# Patient Record
Sex: Female | Born: 1940 | Race: White | Hispanic: No | Marital: Married | Smoking: Never smoker
Health system: Southern US, Community
[De-identification: ages and names within clinical notes are randomized; demographics above are authoritative.]

## PROBLEM LIST (undated history)

## (undated) DIAGNOSIS — C679 Malignant neoplasm of bladder, unspecified: Secondary | ICD-10-CM

## (undated) DIAGNOSIS — F419 Anxiety disorder, unspecified: Secondary | ICD-10-CM

## (undated) DIAGNOSIS — H3530 Unspecified macular degeneration: Secondary | ICD-10-CM

## (undated) DIAGNOSIS — I10 Essential (primary) hypertension: Secondary | ICD-10-CM

## (undated) HISTORY — DX: Unspecified macular degeneration: H35.30

## (undated) HISTORY — PX: APPENDECTOMY: SHX54

## (undated) HISTORY — DX: Essential (primary) hypertension: I10

---

## 2009-05-29 ENCOUNTER — Encounter: Payer: Self-pay | Admitting: Internal Medicine

## 2009-09-15 ENCOUNTER — Encounter: Payer: Self-pay | Admitting: Internal Medicine

## 2009-09-17 ENCOUNTER — Encounter: Payer: Self-pay | Admitting: Internal Medicine

## 2009-09-18 ENCOUNTER — Encounter: Payer: Self-pay | Admitting: Internal Medicine

## 2009-10-29 ENCOUNTER — Encounter: Payer: Self-pay | Admitting: Internal Medicine

## 2009-11-20 ENCOUNTER — Encounter: Payer: Self-pay | Admitting: Internal Medicine

## 2009-11-27 ENCOUNTER — Encounter: Payer: Self-pay | Admitting: Internal Medicine

## 2010-02-05 ENCOUNTER — Encounter: Payer: Self-pay | Admitting: Internal Medicine

## 2010-03-22 ENCOUNTER — Observation Stay (HOSPITAL_COMMUNITY): Admission: EM | Admit: 2010-03-22 | Discharge: 2010-03-22 | Payer: Self-pay | Admitting: Emergency Medicine

## 2010-05-09 ENCOUNTER — Ambulatory Visit: Payer: Self-pay | Admitting: Internal Medicine

## 2010-05-09 DIAGNOSIS — Z8679 Personal history of other diseases of the circulatory system: Secondary | ICD-10-CM | POA: Insufficient documentation

## 2010-05-09 DIAGNOSIS — R5381 Other malaise: Secondary | ICD-10-CM | POA: Insufficient documentation

## 2010-05-09 DIAGNOSIS — I10 Essential (primary) hypertension: Secondary | ICD-10-CM | POA: Insufficient documentation

## 2010-05-09 DIAGNOSIS — R5383 Other fatigue: Secondary | ICD-10-CM

## 2010-05-09 DIAGNOSIS — F411 Generalized anxiety disorder: Secondary | ICD-10-CM | POA: Insufficient documentation

## 2010-05-29 ENCOUNTER — Telehealth: Payer: Self-pay | Admitting: Internal Medicine

## 2010-06-20 ENCOUNTER — Ambulatory Visit: Payer: Self-pay | Admitting: Internal Medicine

## 2010-06-20 ENCOUNTER — Encounter: Payer: Self-pay | Admitting: Internal Medicine

## 2010-06-20 ENCOUNTER — Ambulatory Visit: Payer: Self-pay | Admitting: Cardiology

## 2010-06-20 ENCOUNTER — Ambulatory Visit: Payer: Self-pay

## 2010-06-20 ENCOUNTER — Ambulatory Visit (HOSPITAL_COMMUNITY): Admission: RE | Admit: 2010-06-20 | Discharge: 2010-06-20 | Payer: Self-pay | Admitting: Internal Medicine

## 2010-07-16 ENCOUNTER — Telehealth: Payer: Self-pay | Admitting: Internal Medicine

## 2010-07-22 ENCOUNTER — Telehealth: Payer: Self-pay | Admitting: Internal Medicine

## 2010-08-16 ENCOUNTER — Telehealth: Payer: Self-pay | Admitting: Internal Medicine

## 2010-08-26 ENCOUNTER — Ambulatory Visit: Payer: Self-pay | Admitting: Internal Medicine

## 2010-08-26 DIAGNOSIS — R079 Chest pain, unspecified: Secondary | ICD-10-CM | POA: Insufficient documentation

## 2010-08-29 ENCOUNTER — Ambulatory Visit: Payer: Self-pay | Admitting: Internal Medicine

## 2010-09-03 LAB — CONVERTED CEMR LAB
ALT: 24 units/L (ref 0–35)
Albumin: 4.1 g/dL (ref 3.5–5.2)
Alkaline Phosphatase: 56 units/L (ref 39–117)
BUN: 14 mg/dL (ref 6–23)
Basophils Absolute: 0 10*3/uL (ref 0.0–0.1)
Basophils Relative: 0.5 % (ref 0.0–3.0)
Chloride: 97 meq/L (ref 96–112)
Creatinine,U: 136.6 mg/dL
Direct LDL: 92.3 mg/dL
Free T4: 0.85 ng/dL (ref 0.60–1.60)
Glucose, Bld: 84 mg/dL (ref 70–99)
HDL: 96.8 mg/dL (ref >39.00)
MCHC: 35.1 g/dL (ref 30.0–36.0)
Microalb, Ur: 7.5 mg/dL — ABNORMAL HIGH (ref 0.0–1.9)
Neutro Abs: 3.6 10*3/uL (ref 1.4–7.7)
Neutrophils Relative %: 68 % (ref 43.0–77.0)
RDW: 14.3 % (ref 11.5–14.6)
VLDL: 4.8 mg/dL (ref 0.0–40.0)

## 2010-09-18 ENCOUNTER — Telehealth: Payer: Self-pay | Admitting: Internal Medicine

## 2010-09-20 ENCOUNTER — Telehealth: Payer: Self-pay | Admitting: Internal Medicine

## 2010-09-23 ENCOUNTER — Telehealth: Payer: Self-pay | Admitting: Internal Medicine

## 2010-09-24 ENCOUNTER — Telehealth: Payer: Self-pay | Admitting: Internal Medicine

## 2010-10-01 ENCOUNTER — Telehealth: Payer: Self-pay | Admitting: Internal Medicine

## 2010-10-07 ENCOUNTER — Telehealth: Payer: Self-pay | Admitting: Internal Medicine

## 2010-10-07 ENCOUNTER — Telehealth (INDEPENDENT_AMBULATORY_CARE_PROVIDER_SITE_OTHER): Payer: Self-pay | Admitting: *Deleted

## 2010-10-18 ENCOUNTER — Encounter: Payer: Self-pay | Admitting: Internal Medicine

## 2010-10-19 ENCOUNTER — Telehealth (INDEPENDENT_AMBULATORY_CARE_PROVIDER_SITE_OTHER): Payer: Self-pay | Admitting: *Deleted

## 2010-10-20 ENCOUNTER — Telehealth (INDEPENDENT_AMBULATORY_CARE_PROVIDER_SITE_OTHER): Payer: Self-pay | Admitting: *Deleted

## 2010-10-21 ENCOUNTER — Telehealth: Payer: Self-pay | Admitting: Internal Medicine

## 2010-10-24 ENCOUNTER — Telehealth (INDEPENDENT_AMBULATORY_CARE_PROVIDER_SITE_OTHER): Payer: Self-pay | Admitting: Physician Assistant

## 2010-10-25 IMAGING — CR DG CHEST 2V
2 series · 2 of 2 positions shown · non-contrast
Comparison: None

CLINICAL DATA: Hypertension, weakness

CHEST - 2 VIEW

[w chest pa]
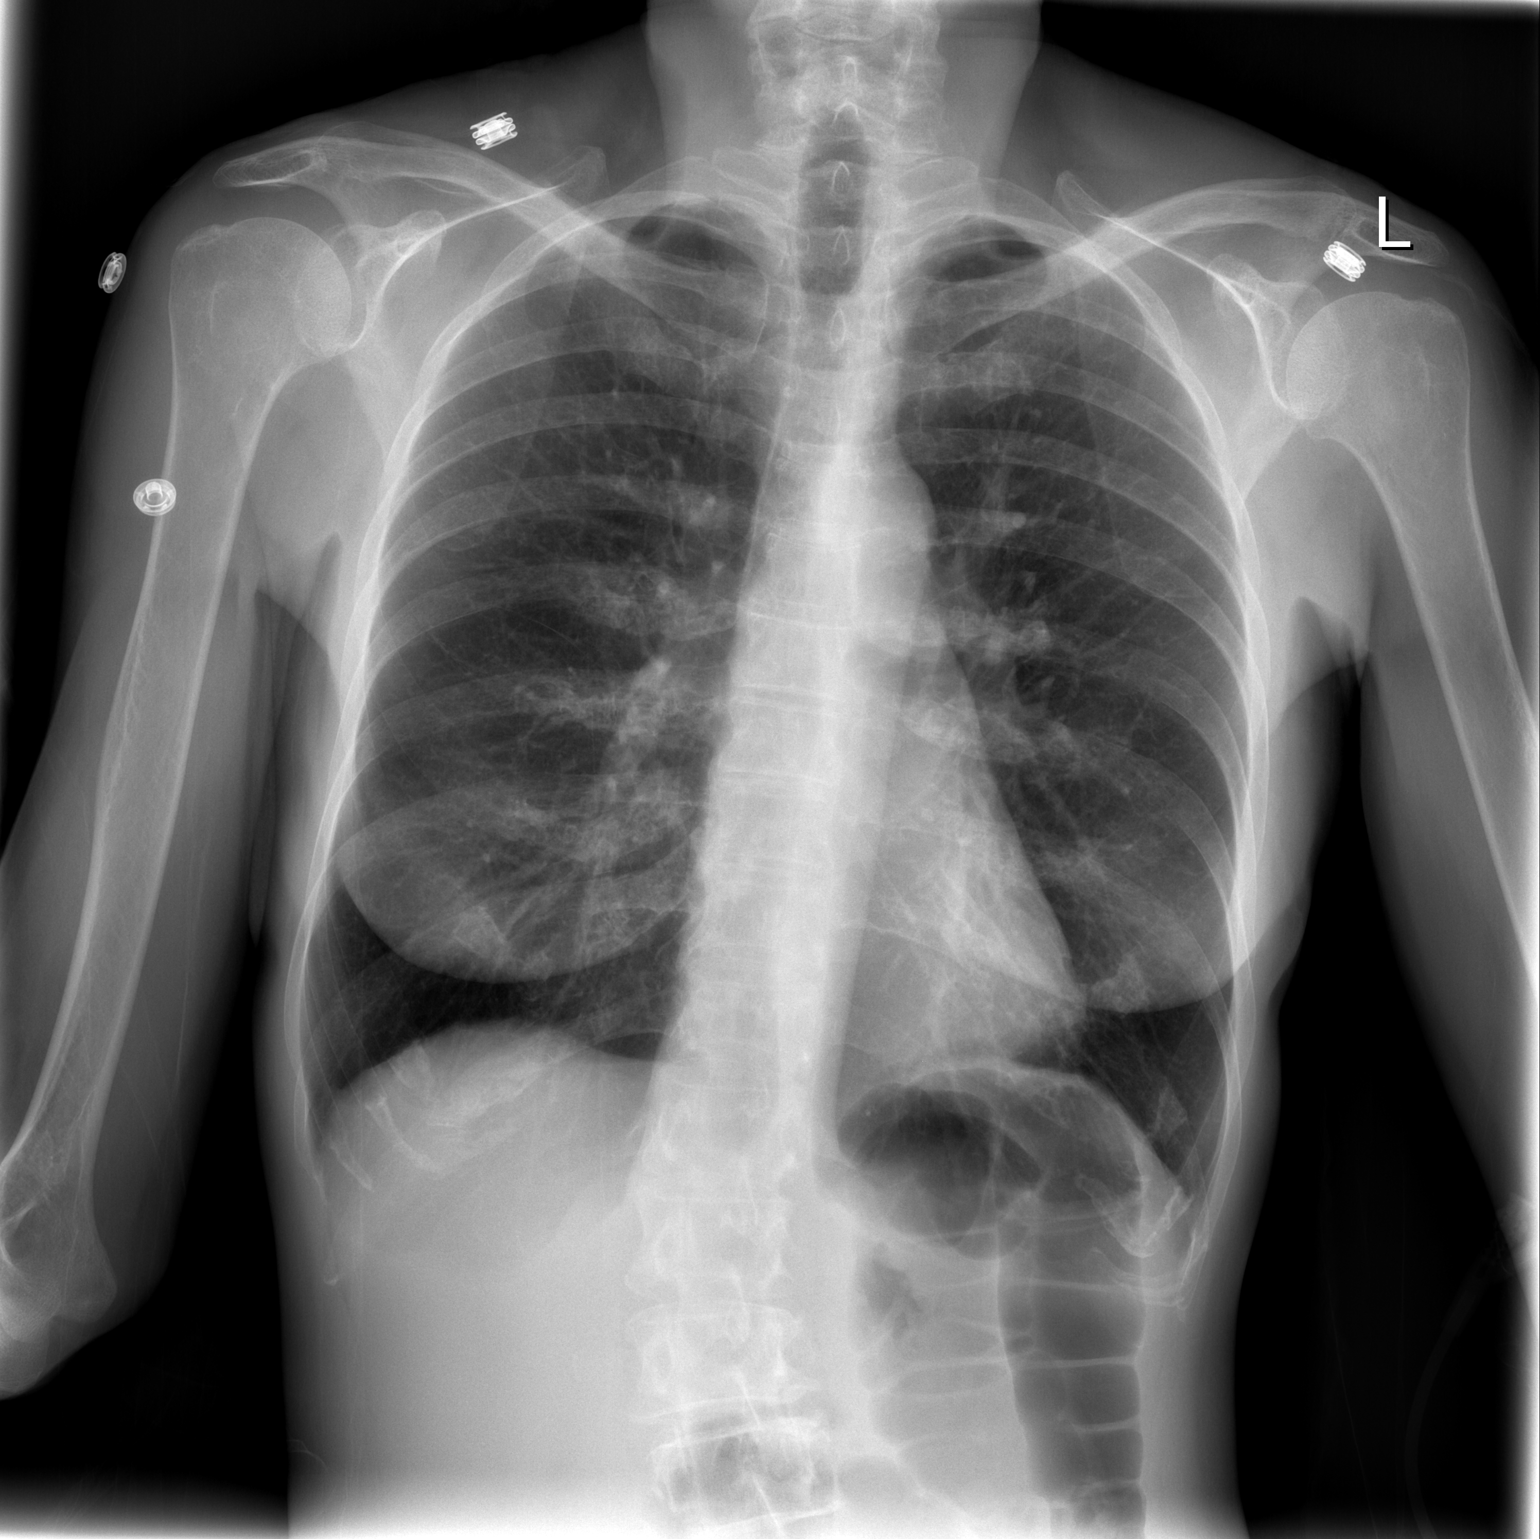

[w chest lat]
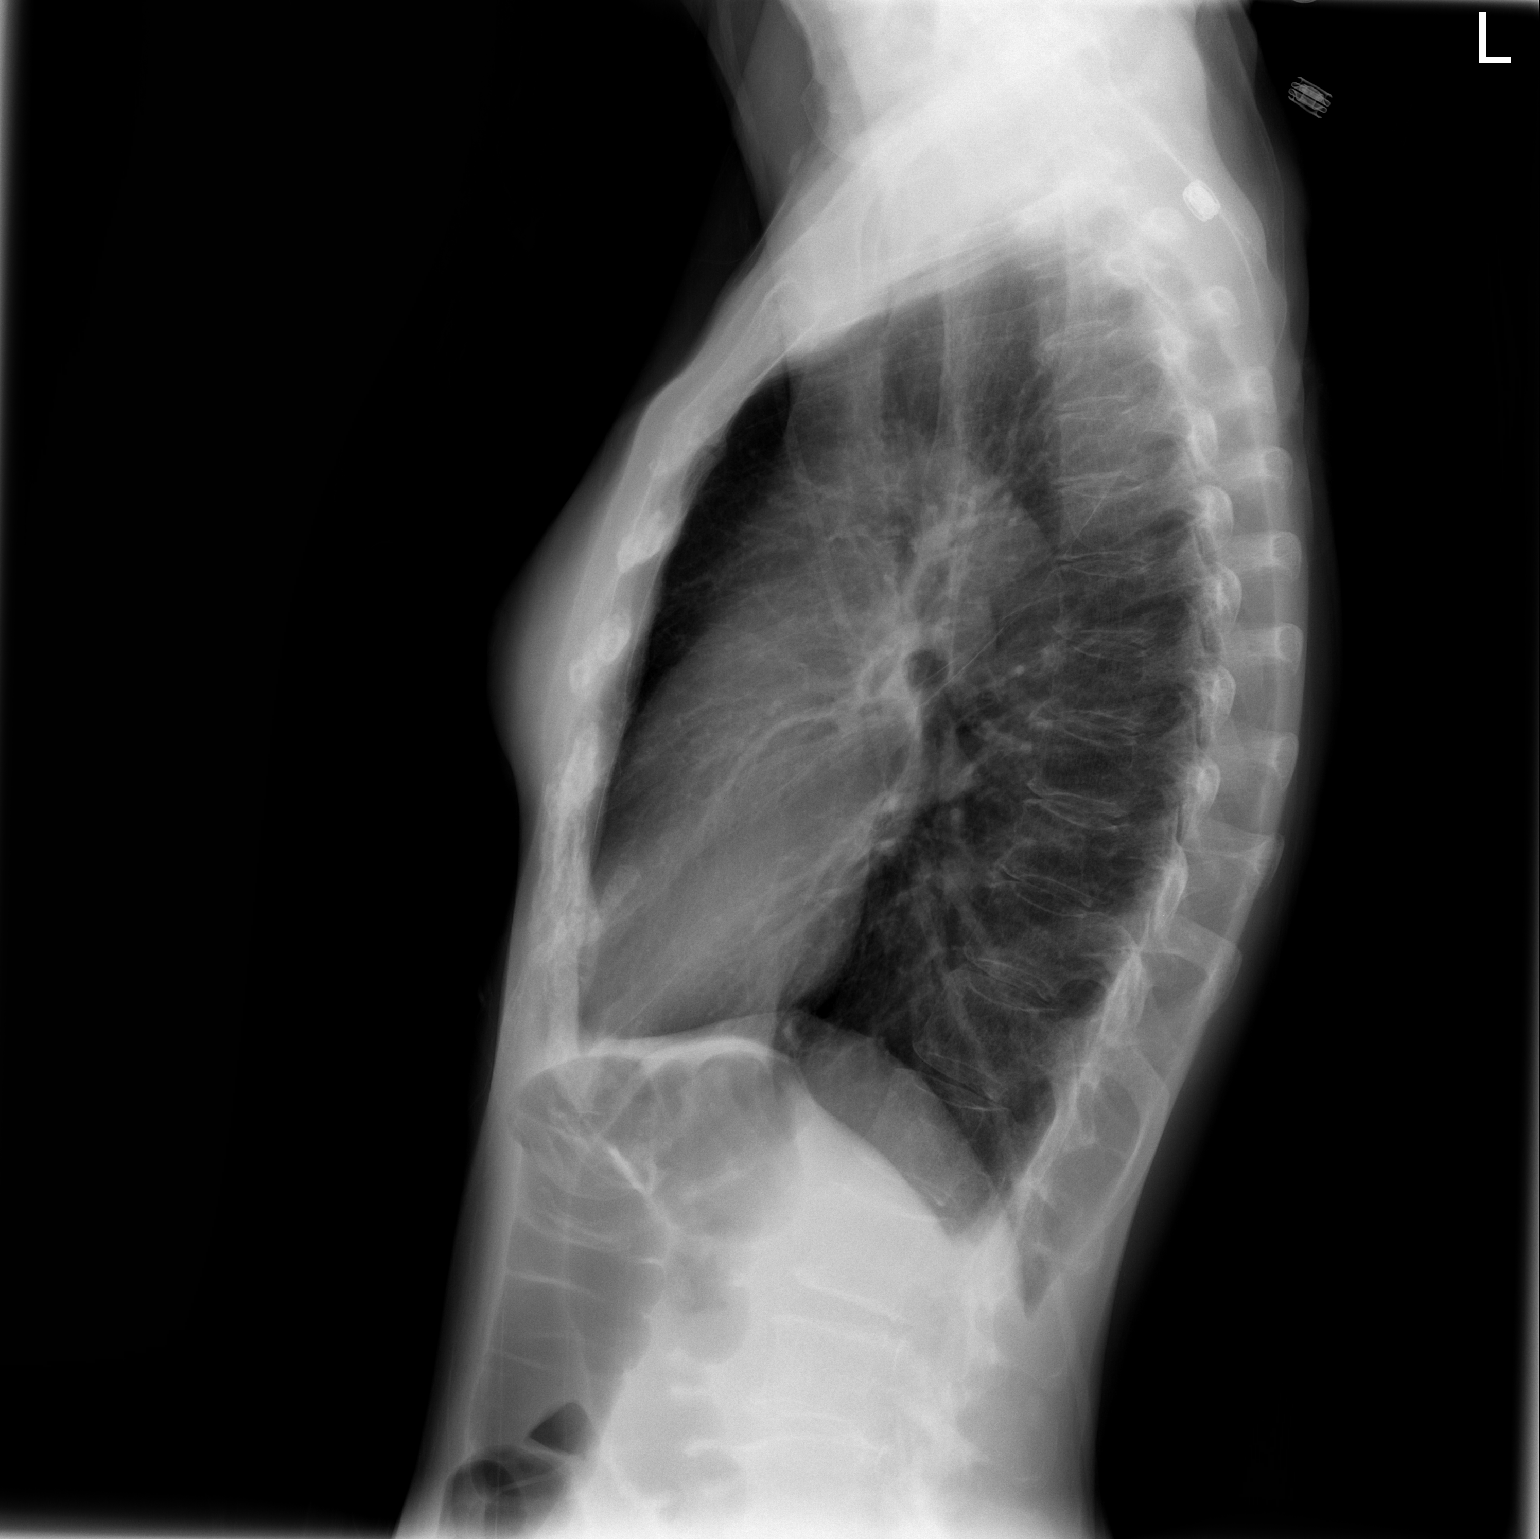

[2 of 2 positions shown; findings below may reference images not displayed]

FINDINGS: Normal heart size, mediastinal contours, and pulmonary vascularity.
Emphysematous and minimal bronchitic changes compatible with COPD.
Scattered costal cartilaginous calcifications.
No pulmonary infiltrate, pleural effusion, or pneumothorax.
Bones demineralized with thoracolumbar dextroconvex scoliosis.
IMPRESSION: COPD.
No acute abnormalities.

## 2010-10-28 ENCOUNTER — Telehealth: Payer: Self-pay | Admitting: Internal Medicine

## 2010-10-29 ENCOUNTER — Telehealth: Payer: Self-pay | Admitting: Internal Medicine

## 2010-11-05 ENCOUNTER — Telehealth: Payer: Self-pay | Admitting: Internal Medicine

## 2010-11-19 ENCOUNTER — Telehealth: Payer: Self-pay | Admitting: Internal Medicine

## 2010-11-22 ENCOUNTER — Telehealth: Payer: Self-pay | Admitting: Internal Medicine

## 2010-12-24 NOTE — Progress Notes (Signed)
Summary: pt calling re seeing lung specialist  Phone Note Call from Patient   Caller: Patient [phone] Reason for Call: Talk to Nurse Summary of Call:  fyi pt went to lung specialist, will do a sleep apnea test, not sure if she has emphysema yet or not, if so it's very mild, will call back when test are complete- Initial call taken by: Glynda Jaeger,  October 29, 2010 4:22 PM  Follow-up for Phone Call        ok will let Dr Gala Romney know Meredith Staggers, RN  October 29, 2010 4:47 PM

## 2010-12-24 NOTE — Letter (Signed)
Summary: [facility] - Post ER Instructions  [facility] - Post ER Instructions   Imported By: Marylou Mccoy 10/22/2010 09:16:59  _____________________________________________________________________  External Attachment:    Type:   Image     Comment:   External Document

## 2010-12-24 NOTE — Progress Notes (Signed)
Summary: questions re medication  Phone Note Call from Patient   Caller: Patient [phone] Reason for Call: Talk to Nurse Summary of Call: pt calling re medication labetalol- "trouble keeping it down all day" pls call  Initial call taken by: Glynda Jaeger,  August 16, 2010 9:18 AM  Follow-up for Phone Call        Pt. said she was experiencing diarrhea and noticed her BP was elevated in the evening between 5-7pm. Advised her to keep a record of her BP readings since her diarrhea is improving and to continue her BP med. routine & to call us back with any further BP concerns. She will keep a record of her readings. Whitney Maeola Sarah RN  August 16, 2010 9:25 AM

## 2010-12-24 NOTE — Progress Notes (Signed)
Summary: pt b/p is elevated again  Phone Note Call from Patient Call back at Home Phone [phone]   Caller: Patient Reason for Call: Talk to Nurse, Talk to Doctor Summary of Call: pt was put on new b/p meds and it stsyed down until the 9th and now it's going back up and it was 169/109 today what dose she need to do Initial call taken by: Omer Jack,  September 18, 2010 12:22 PM  Follow-up for Phone Call        pts BP had been doing well since switching to [place], but she was started on an antibiotic and had a reaction and her BP has been up since then her last dose was on Mon. advised give it some time, she is agreeable wil cont. to monitor and call back early next week if still elevated.

## 2010-12-24 NOTE — Progress Notes (Signed)
Summary: Cardiology- BP Issues  Phone Note Call from Patient Call back at Home Phone [phone]   Caller: Husband Reason for Call: Talk to Doctor Summary of Call: Pt's husband called concerning BP.  Last reading was 174/116, pt is not experiencing headaches or blurred vision now.  She feels alright.  Pt's husband would like to know if there is anything she can take to help her BP before it starts to get too high.  I have reviewed the pts medication and asked her to double her double her benicar to 40 mg by mouth daily and keep a log of her pressures.  Pt is to go to the hospital if her pressures do not lower with the increased benicar, they voice understanding.  They appreciated the call back.  Appears pt will need a f/u appt this week to discuss other BP options, pt will call on Monday.   Initial call taken by: Robbi Garter NP-PA,  October 20, 2010 8:01 AM     Appended Document: Cardiology- BP Issues agree  Appended Document: Cardiology- BP Issues lmtcb./cy  Appended Document: Cardiology- BP Issues PT AWARE./CY

## 2010-12-24 NOTE — Progress Notes (Signed)
Summary: question on meds  Phone Note Call from Patient Call back at Home Phone [phone]   Refills Requested: Medication #1:  DOXAZOSIN MESYLATE 1 MG TABS Take 1 tablet by mouth two times a day. Caller: Spouse/kenneth Reason for Call: Talk to Nurse Summary of Call: pt husband  has question on meds LABETALOL HCL 100 MG and DOXAZOSIN MESYLATE 1 MG. pt had to be taken to the hospital because of her meds. Initial call taken by: Roe Coombs,  October 07, 2010 8:12 AM  Follow-up for Phone Call        spoke w/pt and she adv that this past Sat am she took 1/2 pill of Doxazosin and whole pill in eveing and was so disoriented. B/P 197/109 last night and disoriented and pt went to [facility] ED-she was given clonidine and released. Pt will not take Doxazosin. Needs another med for B/P control. Will await further direction from Dr. Gala Romney.  Follow-up by: Claris Gladden RN,  October 07, 2010 8:55 AM  Additional Follow-up for Phone Call Additional follow up Details #1::        changed to Benicar. we'll see how she does. Dolores Patty, MD, [facility]  October 09, 2010 2:26 PM

## 2010-12-24 NOTE — Progress Notes (Signed)
Summary: BP Meds  Phone Note Call from Patient Call back at Home Phone [phone]   Caller: Patient Reason for Call: Talk to Doctor Summary of Call: Returned call from patient concerned with BP medication she was giving in the emergency department, she had some blurred vision which is improving.  Pt states she went to the ER for elevated BP (200s/110s).  A chest xray was completed that showed emphysema.  Pt was given HCTZ 25 mg daily to go along with her current regimen.  Pt called to see if this was ok to take with her current medication as well as let us know that she has emphysema.  I have informed her that this medication will not interact with her other BP meds.  I have asked her to record her pressures.  She states she would like to discuss this with DB more and I told her I would forward the message to his nurse so she can return the call.  The patient was very appreciative.   Initial call taken by: Robbi Garter NP-PA,  October 19, 2010 5:22 PM

## 2010-12-24 NOTE — Progress Notes (Signed)
Summary: MEDICATION QUESTIONS  Phone Note Call from Patient Call back at Home Phone [phone]   Caller: Patient Summary of Call: QUESTION ABOUT MEDICATIONS Initial call taken by: Judie Grieve,  October 28, 2010 3:18 PM  Follow-up for Phone Call        pt states she is doing better--asking if she can take one-half of HCTZ tomorrow morning--asvised pt if her B/P is not too low it would be OK to take one-half of HCTZ--pt states she will check her B/P tomorrow before she takes medications

## 2010-12-24 NOTE — Progress Notes (Signed)
Summary: wants to change med  Phone Note Call from Patient   Caller: Patient Reason for Call: Talk to Nurse Summary of Call: pt's mouth blisters worse-found on internet that amlodipine by itself won't cause this, but with a history of gingivitis it will-can she get med changed? bp doing well-uses [facility] Initial call taken by: Glynda Jaeger,  July 22, 2010 8:37 AM  Follow-up for Phone Call        spoke w/pt she went to walk-in clinic last week and was told the mouth sourse are gingivits caused by amlodipine, they gave her clotrimazole 10mg  lozenges but told her it probably wouldn't help b/c it was from the amlodipine and that needed to be changed to another med.  Advised pt she can stop amlodipine and will discuss w/Dr Bensimhon on Wed, she is terrified to stop due to her BP it has been doing great and she is scared it will shoot up if she stops, will discuss w/Dr Bensimhon on [employer], RN  July 22, 2010 4:26 PM   Additional Follow-up for Phone Call Additional follow up Details #1::        pt remembered a med she is allergic to, lisinopril Glynda Jaeger  July 23, 2010 11:49 AM     Additional Follow-up for Phone Call Additional follow up Details #2::    lets switch to procardia xl 60 once daily. please check with sally to make sure corss reactivity is not high. Dolores Patty, MD, [facility]  July 23, 2010 10:24 PM  discussed w/Sallly, procardia is calcium channel blocker so will have same risk of gingivitis, will discuss w/Dr Bensimhon and call pt in AM Meredith Staggers, RN  July 24, 2010 6:28 PM   discussed w/Dr Leory Plowman change pt to cozaar 25mg  daily pt is aware and new rx sent into pharmacy,  Meredith Staggers, RN  July 25, 2010 10:58 AM   New/Updated Medications: LOSARTAN POTASSIUM 25 MG TABS (LOSARTAN POTASSIUM) Take 1 tablet by mouth once a day Prescriptions: LOSARTAN POTASSIUM 25 MG TABS (LOSARTAN POTASSIUM) Take 1 tablet by mouth once a day  #30 x 6  Entered by:   Meredith Staggers, RN   Authorized by:   Dolores Patty, MD, [facility]   Signed by:   Meredith Staggers, RN on 07/25/2010   Method used:   Electronically to        [facility] [phone]* (retail)       [address]       [place], [place]  [address]       Ph: [phone]       Fax: [phone]   RxID:   [id number]

## 2010-12-24 NOTE — Progress Notes (Signed)
Summary: pt has questions re medication  Phone Note Call from Patient   Caller: Patient [phone] Reason for Call: Talk to Nurse Summary of Call: pt having problem with bp, has questions re med and water pills can be reached until 2p or after 4p Initial call taken by: Glynda Jaeger,  October 21, 2010 8:16 AM  Follow-up for Phone Call        HAD TO GO TO THE HOSPITAL ON FRIDAY 165/90'S UP TO 222/122.  WAS GIVEN "WATER PILL AND CLONIDINE"  TOOK 2 BENICAR AS ORDERED 104/70 TODAY AND SHE IS HAPPY WITH HER BLOOD PRESSURE BUT HER  HEAD FEELS HEAVY AND IS HAVING HEADACHES AND FEELS TIRED.  SHE IS SEEING HER PRIMARY MD TODAY.  SHE WILL CONTINUE MEDS AS RXED AND FOLLOW UP WITH PRIMARY CARE AND DB AS ORDERED. Follow-up by: Charolotte Capuchin, RN,  October 21, 2010 9:35 AM

## 2010-12-24 NOTE — Progress Notes (Signed)
Summary: pt having sores in mouth  Phone Note Call from Patient   Caller: Patient [phone] Reason for Call: Talk to Nurse Summary of Call: pt calling re problems with bp-getting sores in mouth again-should she see pcp- pt on nisolditine Initial call taken by: Glynda Jaeger,  September 23, 2010 1:38 PM  Follow-up for Phone Call        having sores on both sides of mouth - white sore patches-pt will contact her primary care MD for eval.  she will call back if no improvment Follow-up by: Charolotte Capuchin, RN,  September 23, 2010 2:48 PM     Appended Document: pt having sores in mouth spoke w/pt she is seeing pcp this afternoon at 2:30, will let us know what they say

## 2010-12-24 NOTE — Progress Notes (Signed)
Summary: meds question on 7/28  Phone Note Call from Patient Call back at Home Phone [phone]   Caller: Patient Reason for Call: Talk to Nurse Details for Reason: has question regarding appt on 7/28-meds Initial call taken by: Lorne Skeens,  May 29, 2010 2:34 PM  Follow-up for Phone Call        spoke w/pt regarding meds Meredith Staggers, RN  May 29, 2010 6:01 PM

## 2010-12-24 NOTE — Progress Notes (Signed)
Summary: re mouth sores  Phone Note Call from Patient   Caller: Patient [phone] Reason for Call: Talk to Nurse Summary of Call: pt went to dr this pm-they feel that the med nisoldipine is causing her mouth sores-says will not clear up completely while she is taking it-what to do? Initial call taken by: Glynda Jaeger,  September 24, 2010 3:39 PM  Follow-up for Phone Call        per Dr Gala Romney will stop Sular and start pt on Doxazosin 1mg  daily, she is aware and new rx sent in [facility], RN  September 24, 2010 3:50 PM     New/Updated Medications: DOXAZOSIN MESYLATE 1 MG TABS (DOXAZOSIN MESYLATE) Take 1 tablet by mouth once a day Prescriptions: DOXAZOSIN MESYLATE 1 MG TABS (DOXAZOSIN MESYLATE) Take 1 tablet by mouth once a day  #30 x 6   Entered by:   Meredith Staggers, RN   Authorized by:   Dolores Patty, MD, [facility]   Signed by:   Meredith Staggers, RN on 09/24/2010   Method used:   Electronically to        [facility] [phone]* (retail)       [address]       [place], [place]  [address]       Ph: [phone]       Fax: [phone]   RxID:   [id number]

## 2010-12-24 NOTE — Progress Notes (Signed)
Summary: pt b/p is not going down  Phone Note Call from Patient Call back at Home Phone [phone]   Caller: Patient Reason for Call: Talk to Nurse, Talk to Doctor Summary of Call: pt b/p is not going down and she wants to talk to someone Initial call taken by: Omer Jack,  September 20, 2010 12:20 PM  Follow-up for Phone Call        spoke w/pt she states BP is cont. to run high for past week, last week she was put on cefdinir 300mg  two times a day and she dev a reaction which included rash and elevated BP med was stopped.  No she cont. to c/o elevated BP she states it has been running 150-183/90-110, today was 154/89 and 158/90 she c/o headaches w/these elevated BP, will send mess to Dr Gala Romney to review she is aware if she doesn't hear from me today to cont. to monitor Meredith Staggers, RN  September 20, 2010 2:39 PM   Additional Follow-up for Phone Call Additional follow up Details #1::        start doxazosin 1mg  daily. Dolores Patty, MD, [facility]  September 22, 2010 11:32 PM

## 2010-12-24 NOTE — Letter (Signed)
Summary: [facility]   [facility]   Imported By: Roderic Ovens 05/20/2010 08:54:51  _____________________________________________________________________  External Attachment:    Type:   Image     Comment:   External Document

## 2010-12-24 NOTE — Letter (Signed)
Summary: [facility]   [facility]   Imported By: Roderic Ovens 05/20/2010 08:55:30  _____________________________________________________________________  External Attachment:    Type:   Image     Comment:   External Document

## 2010-12-24 NOTE — Progress Notes (Signed)
Summary: Cardiology Phone Note - BP Issues  Phone Note Call from Patient Call back at Home Phone [phone]   Caller: Patient Summary of Call: Pt called this evening 10/07/10 at 8:30pm, stating she had not yet heard back from prior phone call. Apologized for delay; pt was not upset, just concerned. She states her BP is still elevated this evening at 171/93, and she has not received a substitute yet for the doxazosin she could not tolerate. Chart reviewed: pt has hx of essential HTN and anxiety, as well as multiple medication intolerances including sular, amlodipine, clonidine, and now doxazosin. Pt tried losartan before but had chest pain "several days later," uncertain if symptom was directly related to ARB. For now, will try to initiate Benicar. Pt's last BMET 08/2010 showed a normal creatinine. [facility] was closing in 10 minutes so I found her a 24-hr [facility] and called in Benicar 20mg  by mouth daily disp #30 with 1 refill. Pt was appreciative and is going to pick up prescription. Will forward message to triage RN as well as Dr. Gala Romney for review. We should tread lightly in this pt with medication intolerances.  May benefit from referral back to PCP for anxiety. If possible, please call pt this Thurs or Friday to see if she is still tolerating the medicine and arrange for BMET in 1 week to ensure stable K+. Initial call taken by: Ronie Spies PA-C     Appended Document: Cardiology Phone Note - BP Issues spoke w/pt she states she is doing ok right now she is a little more fatigued than usually but will cont. w/med and see how she does

## 2010-12-24 NOTE — Assessment & Plan Note (Signed)
Summary: np6/ self referral / b/p issues/ pt has medicare/   Visit Type:  Initial Consult  CC:  Blood pressure issues.  History of Present Illness: 70 y/o woman with h/o HTN and anxiety referred for BP management.  Denies h/o known heart disease. Has never had a cath or stress test.  Doing well unti 2 or 3 years ago when she was diagnosed with HTN. Has seen many doctors including renal and endocrine. has had an extensive work-up. Adrenals normal, no evidence of renal artery stenosis. 24 hour urine normal. TSH minimally elevated With normal T3 and T4. Ab u/s normal adrenals.   Synthroid started but couldn't tolerate.  Has had problems with multiple medication intolerances including what sounds like angioedema.  Recently admitted to [facility] in April 2011 with uncontrolled HTN and severe neck and arm pain. Ruled out for MI with serial enzymes. Chest CT negative for PE.  Neck CT showed moderate degenative disk disease. Seeing a chriopractor which has helped.  Denies CP or SOB but says her exercise tolerance is not what it usd to be and now gets exhausted easily. Used to walk on treadmill for 90 minutes but now can just walk for 30 minutes and gets tired. Very anxious. Trivial edema in left leg with amlodipine.   Now taking BP 4-6 times per day. Systolics range from 91-170 (170 with stress) - average is 120-130.  Drinks alot of water and doesn't use any salt. Was on Buspar for a while but stopped.   Anticoagulation Management History:      Positive risk factors for bleeding include an age of 38 years or older.  The bleeding index is 'intermediate risk'.  Positive CHADS2 values include History of HTN.  Negative CHADS2 values include Age > 24 years old.     Current Medications (verified): 1)  Labetalol Hcl 100 Mg Tabs (Labetalol Hcl) .... Take 2  Tablet Sby Mouth Twice A Day 2)  Amlodipine Besylate 5 Mg Tabs (Amlodipine Besylate) .... Take 1/4 Tablet Two Times A Day 3)  Clonidine Hcl 0.1  Mg/24hr Ptwk (Clonidine Hcl) .... Change Patch Every Friday 4)  Multivitamins  Tabs (Multiple Vitamin) .... Take 1 Tablet By Mouth Two Times A Day 5)  Garlic Oil 1000 Mg Caps (Garlic) .... Take 1 Capsule By Mouth Once A Day 6)  L-Lysine Hcl 500 Mg Tabs (Lysine Hcl) .... Take 1 Tablet By Mouth Once A Day 7)  [employer] .... Take 2 Tablets Two Times A Day 8)  Coq10 100 Mg Caps (Coenzyme Q10) .... Take 1 Capsule By Mouth Once A Day 9)  Cranberry 250 Mg Caps (Cranberry) .... 2000mg  Daily 10)  Magnesium 500 Mg Tabs (Magnesium) .... Take 1 Tablet By Mouth Once A Day 11)  Evening Primrose Oil 1000 Mg Caps (Evening Primrose Oil) .... 1300mg  Two Times A Day 12)  Vitamin C 500 Mg  Tabs (Ascorbic Acid) .... Take 2 Tablet By Mouth Once A Day 13)  Refresh Dry Eye Therapy 1-1 % Soln (Glycerin-Polysorbate 80) .... Two Times A Day  Allergies (verified): 1)  ! Sulfa  Past History:  Family History: Last updated: 05/09/2010 Significant for hypertension in her father Mother died of alzheimer's  Brother died at 66 of cancer     Social History: Last updated: 05/09/2010 She denies tobacco, alcohol or illicit drug use.  She   used to work as a Comptroller for elderly people.  Past Medical History: HTN Anxiety Degenerative Disk disease  Family History: Reviewed  history and no changes required. Significant for hypertension in her father Mother died of alzheimer's  Brother died at 73 of cancer     Social History: Reviewed history and no changes required. She denies tobacco, alcohol or illicit drug use.  She   used to work as a Comptroller for elderly people.  Review of Systems       As per HPI and past medical history; otherwise all systems negative.   Vital Signs:  Patient profile:   70 year old female Height:      62 inches Weight:      100.50 pounds BMI:     18.45 Pulse rate:   73 / minute Pulse rhythm:   regular Resp:     198 per minute BP sitting:   152 / 86  (left arm) Cuff size:    regular  Vitals Entered By: Vikki Ports (May 09, 2010 9:49 AM)  Physical Exam  General:  Thin. No acute distress no resp difficulty HEENT: normal Neck: supple. no JVD. Carotids 2+ bilat; no bruits. No lymphadenopathy or thryomegaly appreciated. Cor: PMI nondisplaced. Regular rate & rhythm. No rubs, murmur. +S4 Lungs: clear Abdomen: soft, nontender, nondistended. No hepatosplenomegaly. No bruits or masses. Good bowel sounds. Extremities: no cyanosis, clubbing, rash, edema Neuro: alert & orientedx3, cranial nerves grossly intact. moves all 4 extremities w/o difficulty. affect pleasant    Impression & Recommendations:  Problem # 1:  HYPERTENSION, BENIGN (ICD-401.1) We had a long talk about the mechanisms of HTN and the fact that she has had a very thorough work-up for secondary causes of HTN which has been unrevealing. So, at this point despite her slightly accelerated course of HTN I do think that this is probably essential HTN related to stiffening of the vasculature. After much trial and tribulation she seems to have found a regimen that works fairly well for her though she is having some fatigue. I told her this should abate over time, but if it doesn'tI  told her and her husband that if the fatigue persists they could begin to wean the clonidine slowly ONLY if they increase the amlodipine at the same time. I also asked them to stop taking her BP so much and switch just to a few times per week. We will check echo to assess for any strutcural abnormalities. Start ECASA 81.   Problem # 2:  FATIGUE (ICD-780.79) As above. Also check ETT to assess exercise capacity.   Problem # 3:  ANXIETY STATE, UNSPECIFIED (ICD-300.00) Will try low dose Klonopin 0.25 two times a day (can start with 1/2 tablet two times a day). If not working can f/u with Dr. Willa Rough and perhaps conisder low-dose SSRI instead.   Other Orders: EKG w/ Interpretation (93000) Treadmill (Treadmill) Echocardiogram  (Echo)  Anticoagulation Management Assessment/Plan:            Patient Instructions: 1)  Your physician has requested that you have an exercise tolerance test.  For further information please visit [email or web address]  Please also follow instruction sheet, as given. 2)  Your physician has requested that you have an echocardiogram.  Echocardiography is a painless test that uses sound waves to create images of your heart. It provides your doctor with information about the size and shape of your heart and how well your heart's chambers and valves are working.  This procedure takes approximately one hour. There are no restrictions for this procedure. 3)  New medication: Klonopin 0.5 mg take 1/4 tablet by mouth twice a  day. Aspirin 81 mg daily. 4)  Your physician recommends that you schedule a follow-up  appointment in: 4 months Prescriptions: KLONOPIN 0.5 MG TABS (CLONAZEPAM) take 1/2 tablet by mouth twice a day  #30 x 0   Entered by:   Ollen Gross, RN, BSN   Authorized by:   Dolores Patty, MD, [facility]   Signed by:   Ollen Gross, RN, BSN on 05/09/2010   Method used:   Print then Give to Patient   RxID:   [id number]

## 2010-12-24 NOTE — Letter (Signed)
Summary: [facility] Visit Note   [facility] Visit Note   Imported By: Roderic Ovens 05/20/2010 08:56:12  _____________________________________________________________________  External Attachment:    Type:   Image     Comment:   External Document

## 2010-12-24 NOTE — Progress Notes (Signed)
Summary: problem with med  Phone Note Call from Patient   Caller: Patient Reason for Call: Talk to Nurse Summary of Call: pt states she was taken off the patch, which made her increase her amlodipine, which caused sores in her mouth-pls advise [phone] pt cannot take any made they may cause pressure of dryness of her eyes Initial call taken by: Glynda Jaeger,  July 16, 2010 8:46 AM  Follow-up for Phone Call        per pt Dr Gala Romney took her off clonidine patch and increased her amlodipine she states she couldn't take a whole tab so she has been taking 3/4 tab two times a day, she states she has dev. sores in her mouth and on her tongue and she feels its from the med, have not heard of the side effect before, she states she has been having it for a while and it got worse when med was increased, will speak w/our pharmacist and Dr Gala Romney and call her back  Follow-up by: Meredith Staggers, RN,  July 16, 2010 2:09 PM  Additional Follow-up for Phone Call Additional follow up Details #1::        doubt from norvasc but could be. would stop norvasc for 1 week and see if sores go away. then would restart and see if they come back Dolores Patty, MD, [facility]  July 17, 2010 9:29 AM  spoke w/pt she also has spoke w/her pharmacy and she feels it may not be from med, she will cont. w/med and is taking some over the counter med recommended by the pharmacist for the sores, advised if not better she needs to see pcp she is agreeable Meredith Staggers, RN  July 17, 2010 11:20 AM      New/Updated Medications: AMLODIPINE BESYLATE 5 MG TABS (AMLODIPINE BESYLATE) Take 3/4 tablet two times a day

## 2010-12-24 NOTE — Progress Notes (Signed)
  Phone Note Call from Patient   Call For: Dr Nicholes Mango Reason for Call: Talk to Doctor Summary of Call: Pt was concerned because her BP was much lower than usual. She was afraid to take her meds. She had felt bad earlier when her BP dropped. Advised her to hold HCTZ if BP too low. She is to continue other Rx but can take 1/2 dose Benicar as needed. She is to continue the labetalol. Keep f/u appt. Initial call taken by: Park Breed PA-C,  October 22, 2010

## 2010-12-24 NOTE — Assessment & Plan Note (Signed)
Summary: PER CHECK OUT/SF   Visit Type:  4  mo f/u Primary Provider:  Vinnie Level  CC:  pt c/o nausea and lower abdomen pain and cp since she started on Losartan 25 mg 1 qd.....Marland Kitchen  History of Present Illness: 70 y/o woman with h/o HTN and anxiety with multiple medication intolerances. Returns for f/u of her BP.  Recently had echo and ETT. Echo: EF 55-60% with moderate LAE. RV normal. ETT wallked 11:00 mins (!) on Bruce with no evidence of ischemia.  Feels much better after coming off clonidine. BP was doing well on amlodipine but developed sores on mouthand gums. Thought it was due to amlodipine (?gingival hyperplasia).  Amlodpine stopped and switched to losartan. Cannot tolerate losartan due to CP and feels bad.   AM SBP 111-140  PM SBP 112-177. Walking on treadmill without CP or SOB. No HF symptoms. Compliant with meds.   Current Medications (verified): 1)  Labetalol Hcl 100 Mg Tabs (Labetalol Hcl) .... Take 2  Tablet Sby Mouth Twice A Day 2)  Losartan Potassium 25 Mg Tabs (Losartan Potassium) .... Take 1 Tablet By Mouth Once A Day 3)  Multivitamins  Tabs (Multiple Vitamin) .... Take 1 Tablet By Mouth Two Times A Day 4)  Garlic Oil 1000 Mg Caps (Garlic) .... Take 1 Capsule By Mouth Once A Day 5)  L-Lysine Hcl 500 Mg Tabs (Lysine Hcl) .... Take 1 Tablet By Mouth Once A Day 6)  [employer] .... Take 2 Tablets Two Times A Day 7)  Coq10 100 Mg Caps (Coenzyme Q10) .... Take 1 Capsule By Mouth Once A Day 8)  Cranberry 250 Mg Caps (Cranberry) .... 2000mg  Daily 9)  Magnesium 500 Mg Tabs (Magnesium) .... Take 1 Tablet By Mouth Once A Day 10)  Evening Primrose Oil 1000 Mg Caps (Evening Primrose Oil) .... 1300mg  Two Times A Day 11)  Vitamin C 500 Mg  Tabs (Ascorbic Acid) .... Take 2 Tablet By Mouth Once A Day 12)  Refresh Dry Eye Therapy 1-1 % Soln (Glycerin-Polysorbate 80) .... Two Times A Day 13)  Aspirin 81 Mg Tbec (Aspirin) .... Take One Tablet By Mouth Daily  Allergies: 1)  !  Sulfa 2)  ! * Amlodipine  Past History:  Past Medical History: Last updated: 05/09/2010 HTN Anxiety Degenerative Disk disease  Review of Systems       As per HPI and past medical history; otherwise all systems negative.   Vital Signs:  Patient profile:   70 year old female Height:      62 inches Weight:      101 pounds BMI:     18.54 Pulse rate:   66 / minute Pulse rhythm:   regular BP sitting:   136 / 80  (left arm) Cuff size:   regular  Vitals Entered By: Danielle Rankin, CMA (August 26, 2010 11:34 AM)  Physical Exam  General:  Thin. No acute distress no resp difficulty HEENT: normal Neck: supple. no JVD. Carotids 2+ bilat; no bruits. No lymphadenopathy or thryomegaly appreciated. Cor: PMI nondisplaced. Regular rate & rhythm. No rubs, murmur. +S4 Lungs: clear Abdomen: soft, nontender, nondistended. No hepatosplenomegaly. No bruits or masses. Good bowel sounds. Extremities: no cyanosis, clubbing, rash, edema Neuro: alert & orientedx3, cranial nerves grossly intact. moves all 4 extremities w/o difficulty. affect pleasant    Impression & Recommendations:  Problem # 1:  HYPERTENSION, BENIGN (ICD-401.1) BP elevated at night but not too bad. Given side effects with losartan will stop. Not  sure if mouth sores related directly to amlodipine but possibly related. Given good response will try Sular 8.5 and see if she can tolerate. If mouth sores recur will switch to doxazosin 1mg  daily.   Problem # 2:  CHEST PAIN-UNSPECIFIED (ICD-786.50) Unliekly to be cardiac. Stress test and echo reassuring.   Patient Instructions: 1)  Stop Losartan 2)  Start Sular 8.5mg  daily 3)  Return for fasting lab on Thur 9/6 4)  Your physician wants you to follow-up in:  4 months.  You will receive a reminder letter in the mail two months in advance. If you don't receive a letter, please call our office to schedule the follow-up appointment. Prescriptions: SULAR 8.5 MG XR24H-TAB (NISOLDIPINE)  Take 1 tablet by mouth once a day  #30 x 6   Entered by:   Meredith Staggers, RN   Authorized by:   Dolores Patty, MD, [facility]   Signed by:   Meredith Staggers, RN on 08/26/2010   Method used:   Electronically to        [facility] [phone]* (retail)       [address]       [place], [place]  [address]       Ph: [phone]       Fax: [phone]   RxID:   [id number]

## 2010-12-24 NOTE — Progress Notes (Signed)
Summary: prob with bp and new med  Phone Note Call from Patient   Caller: Patient [phone] Reason for Call: Talk to Nurse Summary of Call: bp med not working Initial call taken by: Glynda Jaeger,  October 01, 2010 11:21 AM  Follow-up for Phone Call        pt BP running 160-180s over 90-100s, per Dr Gala Romney can increase doxasozin to 2 tabs, pt is aware and will make change, she wants to take 1 bid advised that was fine Meredith Staggers, RN  October 01, 2010 2:37 PM     New/Updated Medications: DOXAZOSIN MESYLATE 1 MG TABS (DOXAZOSIN MESYLATE) Take 1 tablet by mouth two times a day

## 2010-12-26 NOTE — Progress Notes (Signed)
Summary: Calling medication  Phone Note Call from Patient Call back at [facility] Phone [phone]   Caller: Patient Summary of Call: Pt regarding a medication  Initial call taken by: Judie Grieve,  November 05, 2010 8:44 AM  Follow-up for Phone Call        Left message to call back Meredith Staggers, RN  November 05, 2010 10:02 AM   PT RETURNING CALL [phone] Judie Grieve  November 05, 2010 10:57 AM  Additional Follow-up for Phone Call Additional follow up Details #1::        pt states her sodium dropped to 123 so her hctz was stopped, now it is better but her blood sugar has been low she just wanted [place] to know what was going on [employer], RN  November 05, 2010 11:59 AM

## 2010-12-26 NOTE — Progress Notes (Signed)
Summary: pt spouse states he never got a call back  Phone Note Call from Patient Call back at Home Phone [phone]   Caller: Spouse Reason for Call: Talk to Nurse, Talk to Doctor Complaint: Urinary/GYN Problems Summary of Call: spouse said they never a call regarding medcation and really needs to know what to do Initial call taken by: Omer Jack,  November 22, 2010 4:42 PM  Follow-up for Phone Call        since she doubled Benicar -  she is not able to do any of the cooking and cleaning, can hardly walk d/t weakness.  Wants to know why this has occurred.  Reports BP is not low and within normal and even sometimes still elevated.  will forward information to Dr Gala Romney for his review. Follow-up by: Charolotte Capuchin, RN,  November 22, 2010 5:29 PM  Additional Follow-up for Phone Call Additional follow up Details #1::        We will check back with her and see how she is feeling and what her BP is doing. Can cut Benicar back if needed. Dolores Patty, MD, [facility]  November 29, 2010 12:47 PM  spoke w/pt and her husband they state pt is doing better since she has been using cpap machine, BP running 130-140s/70-90s will cont. to monitor Meredith Staggers, RN  November 29, 2010 3:28 PM

## 2010-12-26 NOTE — Progress Notes (Signed)
Summary: medications  Phone Note Call from Patient Call back at Home Phone [phone]   Caller: Patient Summary of Call: per pt husband pt been having some breathing problem. pt PCP ordered a sleep study. pt has been DX with sleep apnea and wants to know if the benicar is know for causing sleep apnea. Pt husband wants to know if there is something else that she can take.  Initial call taken by: Edman Circle,  November 19, 2010 8:36 AM  Follow-up for Phone Call        I spoke with patient's husband and he advised me that she has had problems with sleep apnea for the past maybe 6 weeks. he states that she had a sleep study at [facility] in [facility] that was Positive and now she is using  a Bipap machine. They are concerned that The Benicar medication that she is taking is contributing to her sleep apnea. Her BP readings are as follows:12/23 153/8,12/24 133/70,12/25 125/82, 12/26 153/80,12/27 149/79. They will have the sleep study faxed here and then Dr.Bensimhon can review the study and see if he wants her to stay on medication or change. I advised them that Dr. Dorthea Cove  is back in the office on 12/28 in the pm and that we will call him back again after physician review. Layne Benton, RN, BSN  November 19, 2010 9:36 AM   Additional Follow-up for Phone Call Additional follow up Details #1::        Benicar doesn't cause OSA. BPs look good. Continue current regimen. Dolores Patty, MD, [facility]  November 20, 2010 3:54 PM     New/Updated Medications: BENICAR 20 MG TABS (OLMESARTAN MEDOXOMIL) 1 tab 2 times per day

## 2011-02-11 LAB — POCT I-STAT, CHEM 8
BUN: 8 mg/dL (ref 6–23)
Creatinine, Ser: 0.5 mg/dL (ref 0.4–1.2)
TCO2: 27 mmol/L (ref 0–100)

## 2011-02-11 LAB — POCT CARDIAC MARKERS
Myoglobin, poc: 78.1 ng/mL (ref 12–200)
Myoglobin, poc: 78.4 ng/mL (ref 12–200)
Troponin i, poc: <0.05 ng/mL (ref 0.00–0.09)
Troponin i, poc: <0.05 ng/mL (ref 0.00–0.09)

## 2011-02-11 LAB — DIFFERENTIAL
Basophils Absolute: 0.1 10*3/uL (ref 0.0–0.1)
Lymphocytes Relative: 24 % (ref 12–46)
Monocytes Relative: 9 % (ref 3–12)
Neutrophils Relative %: 61 % (ref 43–77)

## 2011-02-11 LAB — CARDIAC PANEL(CRET KIN+CKTOT+MB+TROPI)
Relative Index: INVALID (ref 0.0–2.5)
Total CK: 75 U/L (ref 7–177)
Troponin I: 0.01 ng/mL (ref 0.00–0.06)

## 2011-02-11 LAB — TROPONIN I: Troponin I: 0.01 ng/mL (ref 0.00–0.06)

## 2011-02-11 LAB — CBC
HCT: 40.9 % (ref 36.0–46.0)
MCHC: 35.4 g/dL (ref 30.0–36.0)
MCV: 100.7 fL — ABNORMAL HIGH (ref 78.0–100.0)
Platelets: 279 10*3/uL (ref 150–400)
RDW: 13.8 % (ref 11.5–15.5)
WBC: 7.9 10*3/uL (ref 4.0–10.5)

## 2011-02-11 LAB — CK TOTAL AND CKMB (NOT AT ARMC)
Relative Index: INVALID (ref 0.0–2.5)
Total CK: 79 U/L (ref 7–177)

## 2011-05-21 ENCOUNTER — Encounter: Payer: Self-pay | Admitting: Internal Medicine

## 2016-02-05 ENCOUNTER — Encounter: Payer: Self-pay | Admitting: Pediatrics

## 2016-02-05 ENCOUNTER — Ambulatory Visit (INDEPENDENT_AMBULATORY_CARE_PROVIDER_SITE_OTHER): Payer: Medicare Other | Admitting: Pediatrics

## 2016-02-05 VITALS — BP 140/84 | HR 56 | Temp 98.2°F | Resp 16 | Ht 62.4 in | Wt 103.6 lb

## 2016-02-05 DIAGNOSIS — T7800XA Anaphylactic reaction due to unspecified food, initial encounter: Secondary | ICD-10-CM | POA: Insufficient documentation

## 2016-02-05 DIAGNOSIS — G4733 Obstructive sleep apnea (adult) (pediatric): Secondary | ICD-10-CM | POA: Diagnosis not present

## 2016-02-05 DIAGNOSIS — T7800XD Anaphylactic reaction due to unspecified food, subsequent encounter: Secondary | ICD-10-CM | POA: Diagnosis not present

## 2016-02-05 DIAGNOSIS — J984 Other disorders of lung: Secondary | ICD-10-CM

## 2016-02-05 DIAGNOSIS — J3089 Other allergic rhinitis: Secondary | ICD-10-CM | POA: Diagnosis not present

## 2016-02-05 MED ORDER — DOXYCYCLINE HYCLATE 100 MG PO TABS
100.0000 mg | ORAL_TABLET | Freq: Two times a day (BID) | ORAL | Status: AC
Start: 2016-02-05 — End: ?

## 2016-02-05 NOTE — Patient Instructions (Addendum)
Doxycycline 100 mg every 12 hours for 10 days. I have seen patients with similar dermatitis which is due to  a skin bacterial infection Prednisone 20 mg twice a day for 3 days, 20 mg on the fourth day, 10 mg on the fifth day to bring the rash under control . Follow-up next week Have her family doctor evaluate her restrictive lung disease. She may have silent gastroesophageal reflux causing her coughing after eating. We should keep in mind aspiration of stomach contents which may be silent.  Avoid large amounts of tomato

## 2016-02-05 NOTE — Progress Notes (Signed)
[place] [id number] Dept: [phone]  New Patient Note  Patient ID: Kristin Newton, female    DOB: [birth year]  Age: 75 y.o. MRN: [id number] Date of Office Visit: 02/05/2016 Referring provider: Colonel Bald, MD [address] [place], [place] [address]    Chief Complaint: Sleep Apnea; Rash; and Cough  HPI Kristin Newton presents for evaluation of a rash around her mouth for several months. She has obstructive sleep apnea and uses CPAP.There is concern about a contact allergy to CPAP mask She also has been complaining of coughing after eating for several months. She has had pneumonia once or twice in the past..  Review of Systems  Constitutional: Negative.   HENT:       Obstructive sleep apnea requiring CPAP  Eyes:       Wet macular degeneration  Respiratory:       Cough after eating for several months  Cardiovascular:       Hypertension  Gastrointestinal: Negative.   Genitourinary: Negative.   Musculoskeletal: Negative.   Skin:       Rash on her face for 2 months  Neurological: Negative.   Endo/Heme/Allergies:       Low thyroid function  Psychiatric/Behavioral: Negative.     Outpatient Encounter Prescriptions as of 02/05/2016  Medication Sig  . atenolol (TENORMIN) 50 MG tablet Take 50 mg by mouth daily.  . Cholecalciferol (VITAMIN D-3 PO) Take by mouth.  . Coenzyme Q10 (CO Q-10) 100 MG CAPS Take by mouth.  . desonide (DESOWEN) 0.05 % cream Apply topically 2 (two) times daily.  . Garlic 123XX123 MG CAPS Take by mouth.  . IRON PO Take by mouth.  . losartan (COZAAR) 50 MG tablet Take 50 mg by mouth daily.  . Magnesium 500 MG TABS Take by mouth.  . Multiple Vitamins-Minerals (MULTIVITAMIN ADULT) TABS Take by mouth.  . Probiotic Product (PROBIOTIC DAILY PO) Take by mouth.  . THYROID PO Take by mouth.  . TURMERIC PO Take by mouth.  . doxycycline (VIBRA-TABS) 100 MG tablet Take 1 tablet (100 mg total) by mouth 2 (two) times daily.   No  facility-administered encounter medications on file as of 02/05/2016.     Drug Allergies:  Allergies  Allergen Reactions  . Sulfonamide Derivatives     Family History: Hanne's family history includes Asthma in her brother. There is no history of Allergic rhinitis, Angioedema, Eczema, Immunodeficiency, Urticaria, or Lupus.. Social and environmental. She is retired. There are no pets in the home. She has never smoked cigarettes. She is not around cigarette smoke  Physical Exam: BP 140/84 mmHg  Pulse 56  Temp(Src) 98.2 F (36.8 C) (Oral)  Resp 16  Ht 5' 2.4" (1.585 m)  Wt 103 lb 9.9 oz (47 kg)  BMI 18.71 kg/m2   Physical Exam  Constitutional: She is oriented to person, place, and time. She appears well-developed and well-nourished.  HENT:  Eyes normal. Ears normal. Nose normal. Pharynx normal.  Neck: Neck supple. No thyromegaly present.  Cardiovascular:  S1 and S2 normal no murmurs  Pulmonary/Chest:  Clear to percussion and auscultation  Abdominal: Soft. There is no tenderness (no hepatosplenomegaly).  Lymphadenopathy:    She has no cervical adenopathy.  Neurological: She is alert and oriented to person, place, and time.  Skin:  Erythematous papulosquamous eruption around her mouth  Psychiatric: She has a normal mood and affect. Her behavior is normal. Judgment and thought content normal.  Vitals reviewed.   Diagnostics: FVC  1.50 L FEV1 1.05 L. Predicted FVC 2.51 L predicted from 1.87 L. After albuterol 2 puffs FVC 1.40 L FEV1 1.06 L-this shows a moderate reduction in the FVC was no significant improvement after albuterol  Allergy skin testing did not show a significant reaction to a food except for some reactivity to tomato but not totally diagnostics   Assessment Assessment and Plan: 1. Allergy with anaphylaxis due to food, initial encounter      3. Restrictive lung disease   4. Obstructive sleep apnea treated with bilevel positive airway pressure (BiPAP)   5.       Contact Dermatitis possibly related to her BiPAP mask  Meds ordered this encounter  Medications  . doxycycline (VIBRA-TABS) 100 MG tablet    Sig: Take 1 tablet (100 mg total) by mouth 2 (two) times daily.    Dispense:  20 tablet    Refill:  0    For infection    Patient Instructions  Doxycycline 100 mg every 12 hours for 10 days. I have seen patients with similar dermatitis which is due to  a skin bacterial infection Prednisone 20 mg twice a day for 3 days, 20 mg on the fourth day, 10 mg on the fifth day to bring the rash under control . Follow-up next week Have her family doctor evaluate her restrictive lung disease. She may have silent gastroesophageal reflux causing her coughing after eating. We should keep in mind aspiration of stomach contents which may be silent.  Avoid large amounts of tomato      Return in about 1 week (around 02/12/2016).   Thank you for the opportunity to care for this patient.  Please do not hesitate to contact me with questions.  Penne Lash, M.D.  [facility] [address] [place], [place] [address] [phone]

## 2016-02-06 ENCOUNTER — Encounter: Payer: Self-pay | Admitting: Pediatrics

## 2016-02-06 ENCOUNTER — Telehealth: Payer: Self-pay

## 2016-02-06 NOTE — Telephone Encounter (Signed)
Left message on patients answering machine ok per patient. Dr. Emilio Math wanted patient to try another dose of doxycycline and if her same symptoms return tonight to give call in the morning will need to change abx's.

## 2016-02-06 NOTE — Telephone Encounter (Signed)
Patient was seen yesterday and was given prednisone which patient states she is tolerating well and seems to be helping with the bad rash around mouth, but the doxycycline that was prescribed yesterday patient states she is not tolerating. Patient state's she took the 1st one last night around 9 pm patient stated she started experiencing a bad headache and eye pain which lingered into the early morning hours. Patient was up at 5:30 this morning with headache and eye pain still present. Patient stated she was afraid to take any more so I told her to stop the doxycycline until I here from the Dr. In what to do.

## 2016-02-06 NOTE — Telephone Encounter (Signed)
If headache resolved, try another dose tonight.  If symptoms return, will switch abx

## 2016-02-07 NOTE — Telephone Encounter (Signed)
Patient calling and letting us know she tried another dose of the doxycycline last night and the same symptoms returned. The bad headache, eye pain in which this morning her vision is not completely normal she says. Her stomach is hurting also. Pt states she's losing her vision already and if  we prescribe another abx's can the side effects not be the brain or the eyes. Pt states she appreciates what  Dr. Shaune Leeks is trying to do to help this rash around her face and 60 she's not telling the Dr. Johnney Ou to do. Pt states that the prednisone has helped some and rash looking better with the cloth that lines the inside of the mask she use's for her cpap machine is helping along with cleaning it with vinegar has helped, but pt had said if the Dr. Was going to prescribe another abx if the side effects didn't include the brain and eyes.

## 2016-02-07 NOTE — Telephone Encounter (Signed)
Spoke to the patient several times this morning per Dr. Shaune Leeks.  I informed patient to stop both the prednisone and her doxycycline and we will see her next week on Tuesday at 10:45. Dr. Shaune Leeks wanted to know if patient has ever been diagnosed with glaucoma and who her eye Dr. She was seeing. Pt. Has not ever been diagnosed with glaucoma and the pt. Is seeing Dr. Katy Apo OD optometry in [place]. She saw Dr. Eddie Dibbles a year ago and he wanted to see her back in a year to see how her macular degeneration has progressed and if so how much. Dr. Eddie Dibbles said he was going to send her to a specialist but's that all the pt. New. Pt. Was diagnosed with macular degeneration in both eyes a little over 1 year ago when she was to have cataracts removed and since then her vision has been declining.

## 2016-02-07 NOTE — Telephone Encounter (Signed)
Stop prednisone and doxycycline. See me next week. I am not convinced that this is a side effect of doxycycline.

## 2016-02-08 NOTE — Telephone Encounter (Signed)
Pt.called yesterday and stated her headache and eyes were feeling a lot better

## 2016-02-12 ENCOUNTER — Ambulatory Visit (INDEPENDENT_AMBULATORY_CARE_PROVIDER_SITE_OTHER): Payer: Medicare Other | Admitting: Pediatrics

## 2016-02-12 ENCOUNTER — Encounter: Payer: Self-pay | Admitting: Pediatrics

## 2016-02-12 VITALS — BP 194/90 | HR 66 | Temp 97.4°F | Resp 16

## 2016-02-12 DIAGNOSIS — I10 Essential (primary) hypertension: Secondary | ICD-10-CM

## 2016-02-12 DIAGNOSIS — G4733 Obstructive sleep apnea (adult) (pediatric): Secondary | ICD-10-CM

## 2016-02-12 DIAGNOSIS — L253 Unspecified contact dermatitis due to other chemical products: Secondary | ICD-10-CM | POA: Diagnosis not present

## 2016-02-12 MED ORDER — AZITHROMYCIN 250 MG PO TABS: ORAL_TABLET | ORAL | Status: AC

## 2016-02-12 NOTE — Progress Notes (Signed)
  [place] [address] Dept: [phone]  FOLLOW UP NOTE  Patient ID: Kristin Newton, female    DOB: 12-13-1940  Age: 75 y.o. MRN: [id number] Date of Office Visit: 02/12/2016  Assessment Chief Complaint: Follow-up  HPI Kristin Newton presents for follow-up of a rash around her mouth. She has obstructive sleep apnea and uses BiPAP and some of the mask does touch her around her mouth. The day following starting prednisone 20 mg twice a day she developed a headache which she felt was related to doxycycline but I asked  her to stop prednisone which was more like the culprit. She stopped both prednisone and doxycycline and she does not want to try doxycycline again. She has not seen a pulmonary person for her restrictive lung disease.  She is on desonide 0.05% cream twice a day to red itchy areas and atenolol 50 mg once a day. Her other medications are outlined in the chart.   Drug Allergies:  Allergies  Allergen Reactions  . Doxycycline   . Sulfonamide Derivatives     Physical Exam: BP 194/90 mmHg  Pulse 66  Temp(Src) 97.4 F (36.3 C) (Tympanic)  Resp 16   Physical Exam  Constitutional: She is oriented to person, place, and time. She appears well-developed and well-nourished.  HENT:  Eyes normal. Ears normal. Nose normal. Pharynx normal.  Neck: Neck supple.  Cardiovascular:  S1 and S2 normal no murmurs  Pulmonary/Chest:  Clear to percussion and auscultation  Lymphadenopathy:    She has no cervical adenopathy.  Neurological: She is alert and oriented to person, place, and time.  No headache present  Skin:  Perioral dermatitis with much less erythema than last week  Psychiatric: She has a normal mood and affect. Her behavior is normal. Judgment and thought content normal.  Vitals reviewed.   Diagnostics:   none Assessment and Plan: 1. Obstructive sleep apnea treated with bilevel positive airway pressure (BiPAP)   2. Contact dermatitis due to  chemicals   3. Essential hypertension     Meds ordered this encounter  Medications  . azithromycin (ZITHROMAX Z-PAK) 250 MG tablet    Sig: Take two tablets today then one tablet days 2-5    Dispense:  6 each    Refill:  0    Patient Instructions  See your family doctor to control your blood pressure Do not use prednisone unless blood pressure is well controlled Zithromax 250 mg-take 2 tablets today then 1 tablet once a day for the next 4 days at around the same time each day to see if it helps clear up the rash around her Avoid large amounts of tomato and make sure that acidic foods and drinks do not touch the outside of her lips Desonide 0.05% cream twice a day if needed to red itchy areas    Return in about 2 weeks (around 02/26/2016).    Thank you for the opportunity to care for this patient.  Please do not hesitate to contact me with questions.  Penne Lash, M.D.  [facility] [address] [place], [place] [address] [phone]

## 2016-02-12 NOTE — Patient Instructions (Addendum)
See your family doctor to control your blood pressure Do not use prednisone unless blood pressure is well controlled Zithromax 250 mg-take 2 tablets today then 1 tablet once a day for the next 4 days at around the same time each day to see if it helps clear up the rash around her Avoid large amounts of tomato and make sure that acidic foods and drinks do not touch the outside of her lips Desonide 0.05% cream twice a day if needed to red itchy areas

## 2016-02-26 ENCOUNTER — Ambulatory Visit: Payer: Medicare Other | Admitting: Pediatrics

## 2016-11-20 ENCOUNTER — Telehealth: Payer: Self-pay | Admitting: Cardiovascular Disease

## 2016-11-20 NOTE — Telephone Encounter (Signed)
Received records from [facility] for appointment on 12/26/16 with Dr Oval Linsey.  Records given to [facility] (medical records) for Dr Blenda Mounts schedule on 12/26/16. lp

## 2016-12-26 ENCOUNTER — Ambulatory Visit: Payer: Self-pay | Admitting: Cardiovascular Disease

## 2017-02-12 ENCOUNTER — Encounter: Payer: Self-pay | Admitting: Podiatry

## 2017-02-12 ENCOUNTER — Ambulatory Visit (INDEPENDENT_AMBULATORY_CARE_PROVIDER_SITE_OTHER): Payer: Medicare Other | Admitting: Podiatry

## 2017-02-12 VITALS — Ht 62.0 in | Wt 90.0 lb

## 2017-02-12 DIAGNOSIS — L600 Ingrowing nail: Secondary | ICD-10-CM | POA: Diagnosis not present

## 2017-02-12 DIAGNOSIS — B351 Tinea unguium: Secondary | ICD-10-CM

## 2017-02-12 DIAGNOSIS — M79674 Pain in right toe(s): Secondary | ICD-10-CM | POA: Diagnosis not present

## 2017-02-12 NOTE — Progress Notes (Signed)
SUBJECTIVE: 76 y.o. year old female presents complaining of painful ingrown nail and fungal infection. Patient stated that she got fungal infection after the nail has been taken out at year ago on right great toe. The nail has grown back with deformity and fungal infection. Patient is seeking fungal nail treatment. Has eye problem and does treadmill walking 30 minutes daily.  REVIEW OF SYSTEMS: Pertinent items noted in HPI and remainder of comprehensive ROS otherwise negative.  OBJECTIVE: DERMATOLOGIC EXAMINATION: Fungal infected deformed nail right great toe. Ingrown right great toe, symptomatic.  VASCULAR EXAMINATION OF LOWER LIMBS: All pedal pulses are palpable with normal pulsation.  Capillary Filling times within 3 seconds in all digits.  No edema or erythema noted.Temperature gradient from tibial crest to dorsum of foot is within normal bilateral.  NEUROLOGIC EXAMINATION OF THE LOWER LIMBS: Achilles DTR is present and within normal. Monofilament (Semmes-Weinstein 10-gm) sensory testing positive 6 out of 6, bilateral. Vibratory sensations(128Hz  turning fork) intact at medial and lateral forefoot bilateral.  Sharp and Dull discriminatory sensations at the plantar ball of hallux is intact bilateral.   MUSCULOSKELETAL EXAMINATION: No gross deformities noted.  ASSESSMENT: Onychomycosis with deformity right great toe nail. Ingrown nail right great toe.  PLAN: Reviewed findings and available treatment options. Right great toe nail debrided and home care instruction given. Patient wishes to return in 3 months.

## 2017-02-12 NOTE — Patient Instructions (Signed)
Seen for deformed infected right great toe nail. Debrided and cleansed right great toe nail. May soak and brush cleanse in Vinegar water two times a week. Do brush cleanse after each shower. Return in 3 months or as needed.

## 2017-02-13 ENCOUNTER — Encounter: Payer: Self-pay | Admitting: Podiatry

## 2017-05-14 ENCOUNTER — Ambulatory Visit (INDEPENDENT_AMBULATORY_CARE_PROVIDER_SITE_OTHER): Payer: Medicare Other | Admitting: Podiatry

## 2017-05-14 ENCOUNTER — Encounter: Payer: Self-pay | Admitting: Podiatry

## 2017-05-14 DIAGNOSIS — M79672 Pain in left foot: Secondary | ICD-10-CM | POA: Diagnosis not present

## 2017-05-14 DIAGNOSIS — B351 Tinea unguium: Secondary | ICD-10-CM | POA: Diagnosis not present

## 2017-05-14 DIAGNOSIS — L600 Ingrowing nail: Secondary | ICD-10-CM | POA: Diagnosis not present

## 2017-05-14 DIAGNOSIS — M79671 Pain in right foot: Secondary | ICD-10-CM

## 2017-05-14 NOTE — Progress Notes (Signed)
SUBJECTIVE: 76 y.o. year old female presents requesting toe nails trimmed. She is using vinegar soak for fungal nail. She noted of improvement.  Has eye problem and does treadmill walking 30 minutes daily.  OBJECTIVE: DERMATOLOGIC EXAMINATION: Fungal infected deformed nail right great toe. Ingrown right great toe, symptomatic.  VASCULAR EXAMINATION OF LOWER LIMBS: All pedal pulses are palpable with normal pulsation.  Capillary Filling times within 3 seconds in all digits.  No edema or erythema noted.Temperature gradient from tibial crest to dorsum of foot is within normal bilateral.  NEUROLOGIC EXAMINATION OF THE LOWER LIMBS: Achilles DTR is present and within normal. Monofilament (Semmes-Weinstein 10-gm) sensory testing positive 6 out of 6, bilateral. Vibratory sensations(128Hz  turning fork) intact at medial and lateral forefoot bilateral.  Sharp and Dull discriminatory sensations at the plantar ball of hallux is intact bilateral.   MUSCULOSKELETAL EXAMINATION: No gross deformities noted.  ASSESSMENT: Onychomycosis with deformity right great toe nail. Ingrown nail right great toe.  PLAN: Reviewed findings and available treatment options. Patient wishes to return in 3 months.

## 2017-05-14 NOTE — Patient Instructions (Signed)
Seen for hypertrophic nails. All nails debrided. Continue with foot soak with Vinegar water for fungal nail. Return in 3 months or as needed.

## 2017-08-13 ENCOUNTER — Ambulatory Visit (INDEPENDENT_AMBULATORY_CARE_PROVIDER_SITE_OTHER): Payer: Medicare Other | Admitting: Podiatry

## 2017-08-13 ENCOUNTER — Encounter: Payer: Self-pay | Admitting: Podiatry

## 2017-08-13 ENCOUNTER — Ambulatory Visit: Payer: Medicare Other | Admitting: Podiatry

## 2017-08-13 DIAGNOSIS — M79672 Pain in left foot: Secondary | ICD-10-CM

## 2017-08-13 DIAGNOSIS — M79671 Pain in right foot: Secondary | ICD-10-CM

## 2017-08-13 DIAGNOSIS — B351 Tinea unguium: Secondary | ICD-10-CM

## 2017-08-13 NOTE — Patient Instructions (Signed)
Seen for hypertrophic nails. Noted of improving right great toe nail. All nails debrided. Return in 3 months or as needed.

## 2017-08-13 NOTE — Progress Notes (Signed)
SUBJECTIVE: 76 y.o.year old femalepresents requesting toe nails trimmed. Still having eye problem and does treadmill walking 30 minutes daily.  OBJECTIVE: DERMATOLOGIC EXAMINATION: Fungal infected deformed nail right great toe. Hypertrophic nails x 10.  VASCULAR EXAMINATION OF LOWER LIMBS: All pedal pulses are palpable with normal pulsation.  Capillary Filling times within 3 seconds in all digits.  No edema or erythema noted.Temperature gradient from tibial crest to dorsum of foot is within normal bilateral.  NEUROLOGIC EXAMINATION OF THE LOWER LIMBS: Achilles DTR is present and within normal. Monofilament (Semmes-Weinstein 10-gm) sensory testing positive 6 out of 6, bilateral. Vibratory sensations(128Hz  turning fork) intact at medial and lateral forefoot bilateral.  Sharp and Dull discriminatory sensations at the plantar ball of hallux is intact bilateral.   MUSCULOSKELETAL EXAMINATION: No gross deformities noted.  ASSESSMENT: Painful deformed nail right great toe. Hypertrophic mycotic nails.  PLAN: Reviewed findings and available treatment options. All nails debrided. Patient wishes to return in 3 months.

## 2017-11-12 ENCOUNTER — Ambulatory Visit: Payer: Medicare Other | Admitting: Podiatry

## 2017-11-12 ENCOUNTER — Encounter: Payer: Self-pay | Admitting: Podiatry

## 2017-11-12 DIAGNOSIS — L600 Ingrowing nail: Secondary | ICD-10-CM

## 2017-11-12 DIAGNOSIS — M79671 Pain in right foot: Secondary | ICD-10-CM | POA: Diagnosis not present

## 2017-11-12 DIAGNOSIS — B351 Tinea unguium: Secondary | ICD-10-CM | POA: Diagnosis not present

## 2017-11-12 DIAGNOSIS — M79672 Pain in left foot: Secondary | ICD-10-CM | POA: Diagnosis not present

## 2017-11-12 NOTE — Patient Instructions (Signed)
Seen for hypertrophic nails. All nails debrided. Return in 3 months or as needed.  

## 2017-11-12 NOTE — Progress Notes (Signed)
Subjective: 76 y.o. year old female patient presents complaining of pain in right great toe nails. Patient requests toe nails trimmed.  Not able to see with right eye.  Objective: Dermatologic: Thick yellow deformed nails x 10. Ingrown nail right great toe painful without skin lesion. Vascular: Pedal pulses are all palpable. Orthopedic: No growth deformities. Neurologic: All epicritic and tactile sensations grossly intact.  Assessment: Dystrophic mycotic nails x 10. Pain in right great toe.  Treatment: All mycotic nails debrided.  Return in 3 months or as needed.

## 2018-02-10 ENCOUNTER — Ambulatory Visit: Payer: Medicare Other | Admitting: Podiatry

## 2018-02-10 ENCOUNTER — Encounter: Payer: Self-pay | Admitting: Podiatry

## 2018-02-10 DIAGNOSIS — M79672 Pain in left foot: Secondary | ICD-10-CM

## 2018-02-10 DIAGNOSIS — M79671 Pain in right foot: Secondary | ICD-10-CM | POA: Diagnosis not present

## 2018-02-10 DIAGNOSIS — L600 Ingrowing nail: Secondary | ICD-10-CM | POA: Diagnosis not present

## 2018-02-10 DIAGNOSIS — B351 Tinea unguium: Secondary | ICD-10-CM | POA: Diagnosis not present

## 2018-02-10 NOTE — Progress Notes (Signed)
Subjective: 78 y.o. year old female patient presents complaining of painful nails. Patient requests toe nails trimmed.   Objective: Dermatologic: Thick yellow deformed and ingrown right great toe nail. Hypertrophic nails x 10.  Vascular: Pedal pulses are all palpable. Orthopedic:  No gross deformities. Neurologic: All epicritic and tactile sensations grossly intact.  Assessment: Dystrophic mycotic nails x 10. Ingrown right great toe without infection.  Treatment: All mycotic nails, corns, calluses debrided.  Return in 3 months or as needed.

## 2018-02-10 NOTE — Patient Instructions (Signed)
Seen for hypertrophic nails. All nails debrided. Return in 3 months or as needed.  

## 2018-05-13 ENCOUNTER — Ambulatory Visit: Payer: Medicare Other | Admitting: Podiatry

## 2018-05-13 ENCOUNTER — Encounter: Payer: Self-pay | Admitting: Podiatry

## 2018-05-13 DIAGNOSIS — M79674 Pain in right toe(s): Secondary | ICD-10-CM | POA: Diagnosis not present

## 2018-05-13 DIAGNOSIS — B351 Tinea unguium: Secondary | ICD-10-CM

## 2018-05-13 DIAGNOSIS — L600 Ingrowing nail: Secondary | ICD-10-CM

## 2018-05-13 NOTE — Patient Instructions (Signed)
Seen for hypertrophic nails. All nails debrided. Return in 3 months or as needed.  

## 2018-05-13 NOTE — Progress Notes (Signed)
Subjective: 77 y.o. year old female patient presents complaining of painful nails. Patient requests toe nails trimmed.   Objective: Dermatologic: Thick yellow deformed nails x 10. Thick ingrown hallucal nail on right great toe. Blistered skin distal medial 4th toe left, area is dry. Vascular: Pedal pulses are all palpable. Orthopedic:  Varus roated 4th toe left. Neurologic: All epicritic and tactile sensations grossly intact.  Assessment: Dystrophic mycotic nails x 10. Ingrown hallucal nail right great toe. Varus rotated 4th toe with blister left foot.  Treatment: All mycotic nails debrided.  Advised to change shoes to a big toe box. Return in 3 months or as needed.

## 2018-08-12 ENCOUNTER — Encounter: Payer: Self-pay | Admitting: Podiatry

## 2018-08-12 ENCOUNTER — Ambulatory Visit: Payer: Medicare Other | Admitting: Podiatry

## 2018-08-12 DIAGNOSIS — M79674 Pain in right toe(s): Secondary | ICD-10-CM | POA: Diagnosis not present

## 2018-08-12 DIAGNOSIS — B351 Tinea unguium: Secondary | ICD-10-CM | POA: Diagnosis not present

## 2018-08-12 DIAGNOSIS — L600 Ingrowing nail: Secondary | ICD-10-CM | POA: Diagnosis not present

## 2018-08-12 NOTE — Progress Notes (Signed)
Subjective: 77 y.o. year old female patient presents complaining of painful nails.  Right great toe has been hurting. Patient requests toe nails trimmed.   Objective: Dermatologic: Thick yellow deformed nails x 10.  Thick ingrown hallucal nail on right great toe. Vascular: Pedal pulses are all palpable. Orthopedic:  Varus roated 4th toe left. Neurologic: All epicritic and tactile sensations grossly intact.  Assessment: Dystrophic mycotic nails x 10. Ingrown hallucal nail right great toe.  Treatment: All mycotic nails debrided.  Return in 3 months or as needed.

## 2018-08-12 NOTE — Patient Instructions (Signed)
Seen for hypertrophic nails. All nails debrided. Return in 3 months or as needed.  

## 2018-11-11 ENCOUNTER — Ambulatory Visit: Payer: Medicare Other | Admitting: Podiatry

## 2020-10-01 ENCOUNTER — Other Ambulatory Visit: Payer: Self-pay

## 2020-10-01 ENCOUNTER — Encounter (HOSPITAL_BASED_OUTPATIENT_CLINIC_OR_DEPARTMENT_OTHER): Payer: Self-pay

## 2020-10-01 ENCOUNTER — Emergency Department (HOSPITAL_BASED_OUTPATIENT_CLINIC_OR_DEPARTMENT_OTHER)
Admission: EM | Admit: 2020-10-01 | Discharge: 2020-10-01 | Disposition: A | Discharge status: Home / Self Care | Payer: Medicare Other | Attending: Emergency Medicine | Admitting: Emergency Medicine

## 2020-10-01 ENCOUNTER — Emergency Department (HOSPITAL_BASED_OUTPATIENT_CLINIC_OR_DEPARTMENT_OTHER): Payer: Medicare Other

## 2020-10-01 DIAGNOSIS — Z79899 Other long term (current) drug therapy: Secondary | ICD-10-CM | POA: Diagnosis not present

## 2020-10-01 DIAGNOSIS — I10 Essential (primary) hypertension: Secondary | ICD-10-CM | POA: Diagnosis not present

## 2020-10-01 DIAGNOSIS — Z8551 Personal history of malignant neoplasm of bladder: Secondary | ICD-10-CM | POA: Insufficient documentation

## 2020-10-01 DIAGNOSIS — S3210XA Unspecified fracture of sacrum, initial encounter for closed fracture: Secondary | ICD-10-CM | POA: Insufficient documentation

## 2020-10-01 DIAGNOSIS — S3992XA Unspecified injury of lower back, initial encounter: Secondary | ICD-10-CM | POA: Diagnosis present

## 2020-10-01 DIAGNOSIS — X500XXA Overexertion from strenuous movement or load, initial encounter: Secondary | ICD-10-CM | POA: Diagnosis not present

## 2020-10-01 HISTORY — DX: Malignant neoplasm of bladder, unspecified: C67.9

## 2020-10-01 HISTORY — DX: Anxiety disorder, unspecified: F41.9

## 2020-10-01 MED ORDER — GABAPENTIN 100 MG PO CAPS: 100.0000 mg | ORAL_CAPSULE | Freq: Three times a day (TID) | ORAL | 0 refills | Status: AC

## 2020-10-01 NOTE — ED Triage Notes (Signed)
Pt c/o lower back pain that radiates down bilat LE x 4 days-states she fell "weeks ago"-was seen by PCP with xrays "they gave me everything" after the fall-NAD-to triage in w/c

## 2020-10-01 NOTE — Progress Notes (Signed)
CT scan reviewed.  79 year old female with severe lumbar pain with radiation into her lower extremities.  No obvious neurologic deficits.  CT scan demonstrates bilateral sacral alar insufficiency fractures.  Patient should be referred for interventional radiology sacral plasties.  This can be done either as an outpatient or patient can be admitted to medicine for them to arrange.

## 2020-10-01 NOTE — Discharge Instructions (Signed)
Instructions  You have a fracture, or broken bone in your hip and tailbone.  Your CT scan shows bilateral sacral ala fractures.  Please bring these papers and your CT report to your doctor's appointment tomorrow.    I would recommend that your doctor arrange for an outpatient MRI of the lumbar spine without contrast and MRI of the pelvis.  Our spine specialists felt it was OK to discharge you home if you were able to walk at home and your pain was under control.  You will need to follow up with an interventional radiologist, a specialist for this kind of broken bone.  I gave you the office number for Dr Kathlene Cote - call to make an appointment in 1-2 weeks if possible.   For pain at home you can take tylenol 650 mg every 6 hours, and motrin 400 mg every 6 hours.  I also prescribed a medicine called gabapentin.  You can take 1 tablet by mouth up to three times per day (with breakfast, lunch, and at bedtime).  If this medicine makes you dizzy, sick, or too groggy, STOP taking it.  It is very important you buy a walker and use it at all times.  Be extra careful to avoid any calls.  Try to limit excessive walking, and treadmill use.

## 2020-10-01 NOTE — ED Provider Notes (Signed)
[facility] EMERGENCY DEPARTMENT Provider Note   CSN: [id number] Arrival date & time: 10/01/20  1330     History Chief Complaint  Patient presents with  . Back Pain    Kristin Newton is a 79 y.o. female presenting to ED with back pain Gradual onset 4-5 days ago, woke up with pain Bilateral lower back, aching and burning Pins and needles feeling down bilateral thighs to the knees only Worse with standing, better with sitting Improved with motrin at home Never had this pain before, but does report having some "back aches 3-4 weeks ago from lifting a lot of boxes since we're moving."\ Pain currently 7/10 No weakness in legs, but mobility has been somewhat limited by pain.  She can ambulate however.  No fever, chills, numbness, or objective weakness on exam. No saddle anesthesia, urinary or fecal incontinence or retention. No reported history of immunosuppression, IV drug use, cancer, recent spinal surgery, recent trauma, or falls.   Went to UC today and sent to ED for "more scans"  Has PCP appointment tomorrow  HPI     Past Medical History:  Diagnosis Date  . Anxiety   . Bladder cancer ([place])   . Hypertension   . Macula lutea degeneration     Patient Active Problem List   Diagnosis Date Noted  . Contact dermatitis due to chemicals 02/12/2016  . Essential hypertension 02/12/2016  . Obstructive sleep apnea treated with bilevel positive airway pressure (BiPAP) 02/05/2016  . Restrictive lung disease 02/05/2016  . 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 CHEST PAIN-UNSPECIFIED 08/26/2010  . ANXIETY STATE, UNSPECIFIED 05/09/2010  . HYPERTENSION, BENIGN 05/09/2010  . FATIGUE 05/09/2010  . PALPITATIONS, HX OF 05/09/2010    Past Surgical History:  Procedure Laterality Date  . APPENDECTOMY       OB History   No obstetric history on file.     Family History  Problem Relation Age of Onset  . Asthma Brother   . Allergic rhinitis Neg Hx   .  Angioedema Neg Hx   . Eczema Neg Hx   . Immunodeficiency Neg Hx   . Urticaria Neg Hx   . Lupus Neg Hx     Social History   Tobacco Use  . Smoking status: Never Smoker  . Smokeless tobacco: Never Used  Vaping Use  . Vaping Use: Never used  Substance Use Topics  . Alcohol use: No  . Drug use: No    Home Medications Prior to Admission medications   Medication Sig Start Date End Date Taking? Authorizing Provider  atenolol (TENORMIN) 50 MG tablet Take 50 mg by mouth daily.    [provider]  azithromycin (ZITHROMAX Z-PAK) 250 MG tablet Take two tablets today then one tablet days 2-5 02/12/16   Charlies Silvers, MD  Cholecalciferol (VITAMIN D-3 PO) Take by mouth.    [provider]  Coenzyme Q10 (CO Q-10) 100 MG CAPS Take by mouth.    [provider]  desonide (DESOWEN) 0.05 % cream Apply topically 2 (two) times daily.    [provider]  doxycycline (VIBRA-TABS) 100 MG tablet Take 1 tablet (100 mg total) by mouth 2 (two) times daily. 02/05/16   Charlies Silvers, MD  gabapentin (NEURONTIN) 100 MG capsule Take 1 capsule (100 mg total) by mouth 3 (three) times daily. 10/01/20 10/31/20  Wyvonnia Dusky, MD  Garlic 4098 MG CAPS Take by mouth.    [provider]  IRON PO Take  by mouth.    [provider]  losartan (COZAAR) 50 MG tablet Take 50 mg by mouth daily.    [provider]  Magnesium 500 MG TABS Take by mouth.    [provider]  Multiple Vitamins-Minerals (MULTIVITAMIN ADULT) TABS Take by mouth.    [provider]  Probiotic Product (PROBIOTIC DAILY PO) Take by mouth.    [provider]  THYROID PO Take by mouth.    [provider]  TURMERIC PO Take by mouth.    [provider]    Allergies    Azithromycin, Sulfa antibiotics, Cefdinir, Levothyroxine, Lisinopril, Bromfenac, Sulfamethoxazole, Sulfonamide derivatives, Amlodipine, Chlorhexidine, and Doxycycline  Review of  Systems   Review of Systems  Constitutional: Negative for chills and fever.  Respiratory: Negative for cough and shortness of breath.   Cardiovascular: Negative for chest pain and palpitations.  Gastrointestinal: Negative for abdominal pain and vomiting.  Genitourinary: Negative for dysuria and hematuria.  Musculoskeletal: Positive for arthralgias, back pain and myalgias.  Skin: Negative for color change and rash.  Neurological: Negative for numbness.  Psychiatric/Behavioral: Negative for agitation and confusion.  All other systems reviewed and are negative.   Physical Exam Updated Vital Signs BP (!) 145/77 (BP Location: Left Arm)   Pulse 74   Temp 98.2 F (36.8 C) (Oral)   Resp 16   Ht 5\' 2"  (1.575 m)   Wt 42.6 kg   SpO2 99%   BMI 17.19 kg/m   Physical Exam Vitals and nursing note reviewed.  Constitutional:      General: She is not in acute distress.    Appearance: She is well-developed.  HENT:     Head: Normocephalic and atraumatic.  Cardiovascular:     Rate and Rhythm: Normal rate and regular rhythm.     Pulses: Normal pulses.  Pulmonary:     Effort: Pulmonary effort is normal. No respiratory distress.  Abdominal:     Palpations: Abdomen is soft.     Tenderness: There is no abdominal tenderness.  Musculoskeletal:     Cervical back: Neck supple.  Skin:    General: Skin is warm and dry.  Neurological:     Mental Status: She is alert.     Comments: 5/5 strength in bilateral lower extremities Able to ambulate No saddle anesthesia No sensory loses     ED Results / Procedures / Treatments   Labs (all labs ordered are listed, but only abnormal results are displayed) Labs Reviewed - No data to display  EKG None  Radiology CT Lumbar Spine Wo Contrast  Result Date: 10/01/2020 CLINICAL DATA:  Low back pains. Pain extending lower extremities 4 days. Fall several weeks ago. EXAM: CT LUMBAR SPINE WITHOUT CONTRAST TECHNIQUE: Multidetector CT imaging of the lumbar  spine was performed without intravenous contrast administration. Multiplanar CT image reconstructions were also generated. COMPARISON:  CT abdomen and pelvis 09/14/2020. FINDINGS: Segmentation: 5 non rib-bearing lumbar type vertebral bodies are present. The lowest fully formed vertebral body is L5. Alignment: No significant listhesis is present. Rightward curvature is centered at L1-2. Vertebrae: Inferior endplate fracture at X51 is stable. Sclerotic changes are noted on the left at L1-2 associated with the scoliosis. Vertebral body heights are otherwise maintained. New bilateral sacral ala fractures are present. Paraspinal and other soft tissues: Atherosclerotic changes are present in the aorta and branch vessels without aneurysm. Lung bases are clear. Visualized solid organs are unremarkable. Disc levels: 12 L1: Negative. L1-2: Asymmetric left-sided facet hypertrophy and leftward disc  protrusion contribute to moderate left foraminal stenosis. Mild left subarticular narrowing is present. L2-3: A broad-based disc protrusion is asymmetric to the left. Facet hypertrophy is worse on the left. Mild left foraminal narrowing is present. L3-4: Broad-based disc protrusion is present. Moderate facet hypertrophy is worse on the right. Moderate foraminal narrowing is present bilaterally. Subarticular narrowing is worse on the right. L4-5: A broad-based disc protrusion is present. Moderate facet hypertrophy is worse on the right. Moderate foraminal narrowing is worse right. L5-S1: Mild disc bulging is asymmetric to the right. Facet hypertrophy is worse on the right. No significant stenosis is present. IMPRESSION: 1. New bilateral sacral ala fractures. Recommend MRI of the sacrum for further evaluation of acuity. 2. Stable inferior endplate fracture at F09. 3. Scoliosis of the lumbar spine is centered at L1-2. 4. Multilevel spondylosis of the lumbar spine as described. 5. Moderate left foraminal stenosis at L1-2, mild left  subarticular narrowing, and mild left foraminal narrowing at L2-3. 6. Moderate foraminal narrowing bilaterally at L3-4 and L4-5 is worse on the right. 7. Aortic Atherosclerosis (ICD10-I70.0). Electronically Signed   By: San Morelle M.D.   On: 10/01/2020 16:01    Procedures Procedures (including critical care time)  Medications Ordered in ED Medications - No data to display  ED Course  I have reviewed the triage vital signs and the nursing notes.  Pertinent labs & imaging results that were available during my care of the patient were reviewed by me and considered in my medical decision making (see chart for details).  79 yo female presenting to ED with lower back pain  Differential includes spinal fx vs sciatica vs other  No red flags on exam for spinal cord compression.  She has no weakness on exam.  CT Lumbar spine ordered -  bilateral fracture of the sacral ala noted, suspected to be acute.  This does correspond with her pain.  Case discussed with neurosurgery consultants as noted below - plan for IR f/u as outpatient.  Patient stable ambulating, advised walker use at home for extra stability.  Also will start on low-dose gabapentin 100 mg TID for pain at home.    Pt and her husband updated and agree with plan.  Clinical Course as of Oct 02 102  Mon Oct 01, 2020  1650 Discussed CT findings for bilateral sacral ala fracture with neurosurgery Claiborne Billings PA and Dr Trenton Gammon who recommended pt can be discharged home if she is ambulatory and her pain is under control, can consider gabapentin 100 mg TID for pain if needed, and she will need IR referral for management of this.   [MT]    Clinical Course User Index [MT] Wyvonnia Dusky, MD    Final Clinical Impression(s) / ED Diagnoses Final diagnoses:  Closed fracture of sacrum, unspecified portion of sacrum, initial encounter [facility])    Rx / DC Orders ED Discharge Orders         Ordered    gabapentin (NEURONTIN) 100 MG capsule   3 times daily        10/01/20 1751           Wyvonnia Dusky, MD 10/02/20 [phone]

## 2020-10-02 ENCOUNTER — Ambulatory Visit (HOSPITAL_COMMUNITY)
Admission: RE | Admit: 2020-10-02 | Discharge: 2020-10-02 | Disposition: A | Discharge status: Home / Self Care | Payer: Medicare Other | Source: Ambulatory Visit | Attending: Nurse Practitioner | Admitting: Nurse Practitioner

## 2020-10-02 ENCOUNTER — Other Ambulatory Visit (HOSPITAL_COMMUNITY): Payer: Self-pay | Admitting: Nurse Practitioner

## 2020-10-02 ENCOUNTER — Other Ambulatory Visit: Payer: Self-pay | Admitting: Nurse Practitioner

## 2020-10-02 DIAGNOSIS — M8008XA Age-related osteoporosis with current pathological fracture, vertebra(e), initial encounter for fracture: Secondary | ICD-10-CM | POA: Diagnosis not present

## 2021-05-08 IMAGING — MR MR PELVIS W/O CM
5 series · 48 of 48 positions shown · non-contrast
Comparison: CT scan 10/01/2020

CLINICAL DATA: Back pain. Sacral fracture suspected on CT scan.

EXAM:
MRI PELVIS WITHOUT CONTRAST
TECHNIQUE: Multiplanar multisequence MR imaging of the pelvis was performed. No
intravenous contrast was administered.

[Series 5: T1 · coronal · 5.0mm · 1.02mm/px · 6 of 29 slices shown (1 of 2)]
[im 1/29]
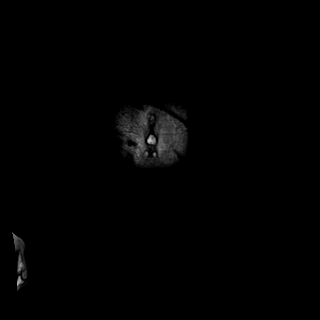
[im 6/29]
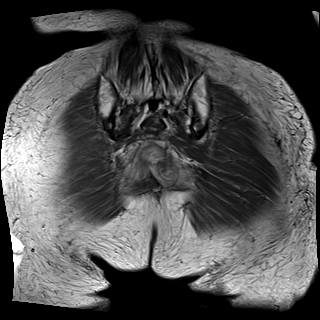
[im 12/29]
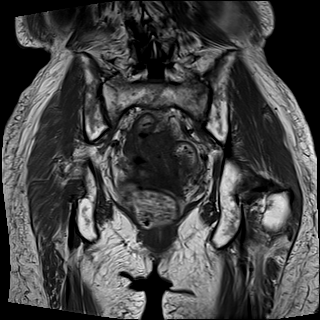
[im 17/29]
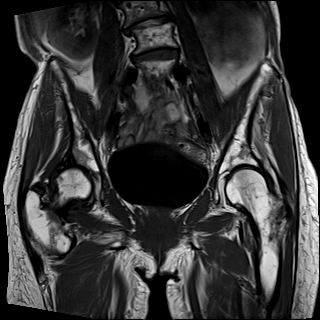
[im 23/29]
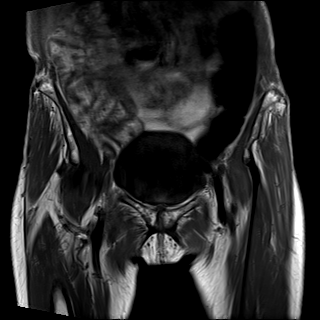
[im 29/29]
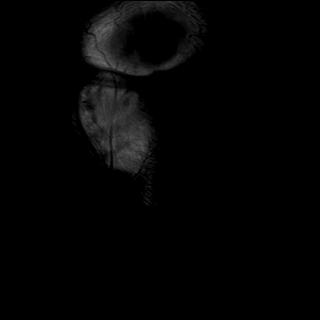

[Series 6: STIR · coronal · 5.0mm · 1.02mm/px · 7 of 29 slices shown (1 of 2)]
[im 1/29]
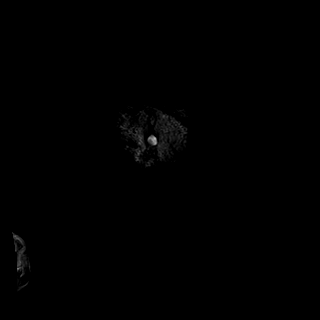
[im 5/29]
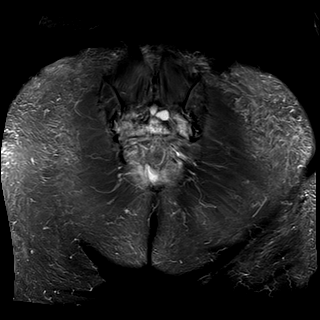
[im 10/29]
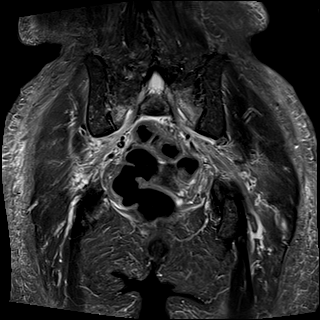
[im 15/29]
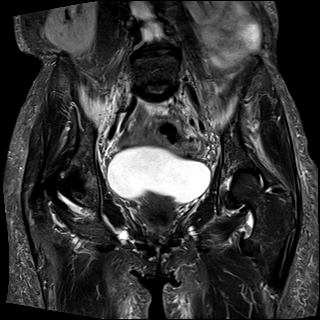
[im 19/29]
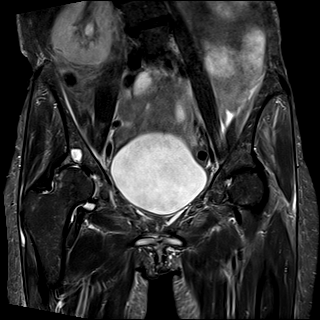
[im 24/29]
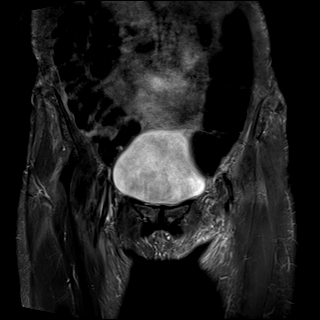
[im 29/29]
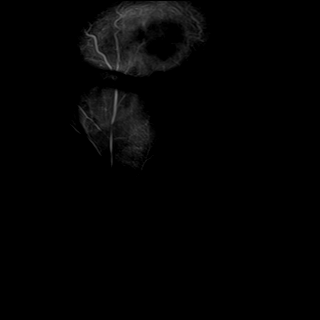

[Series 7: T1 · axial · 4.0mm · 1.02mm/px · z∈[-138,+97]mm · 11 of 48 slices shown (2 of 2)]
[im 1/48]
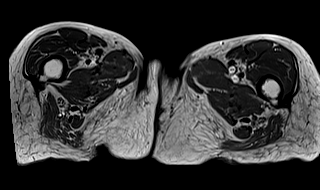
[im 5/48]
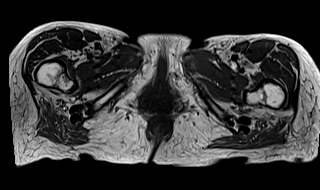
[im 10/48]
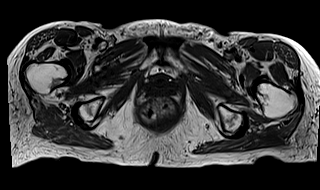
[im 15/48]
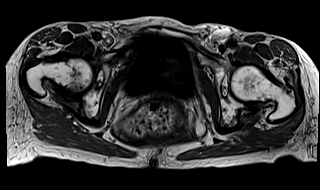
[im 19/48]
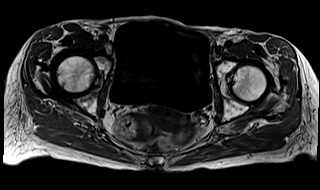
[im 24/48]
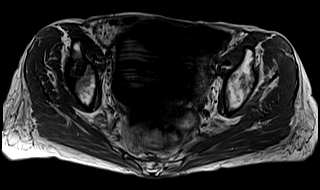
[im 29/48]
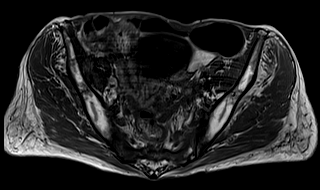
[im 33/48]
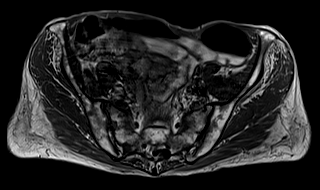
[im 38/48]
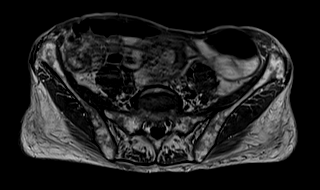
[im 43/48]
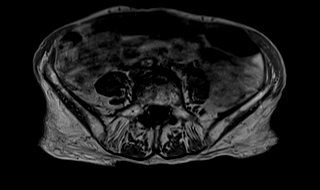
[im 48/48]
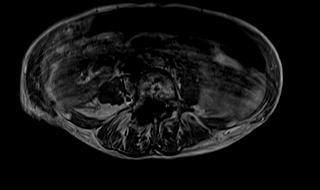

[Series 8: STIR · axial · 4.0mm · 1.02mm/px · z∈[-138,+97]mm · 11 of 48 slices shown (2 of 2)]
[im 1/48]
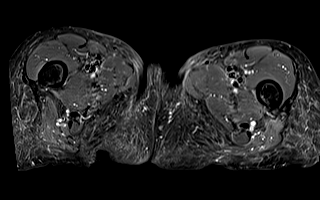
[im 5/48]
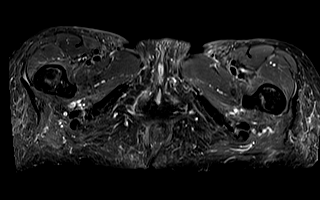
[im 10/48]
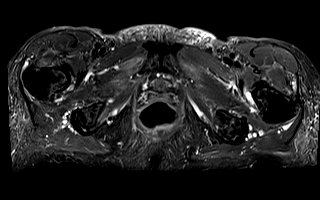
[im 15/48]
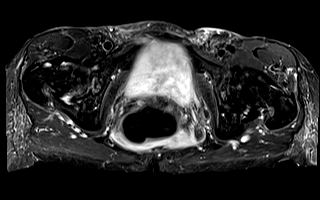
[im 19/48]
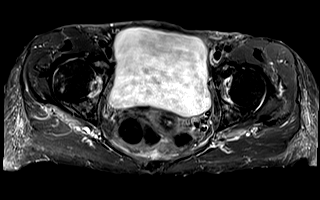
[im 24/48]
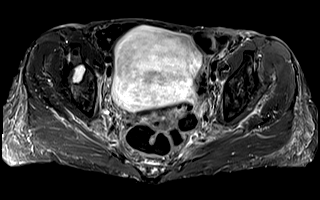
[im 29/48]
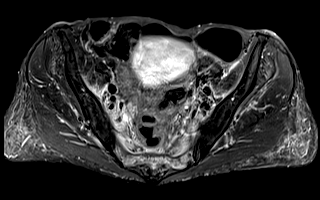
[im 33/48]
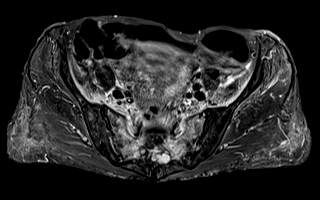
[im 38/48]
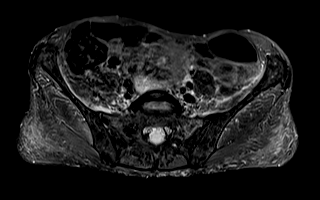
[im 43/48]
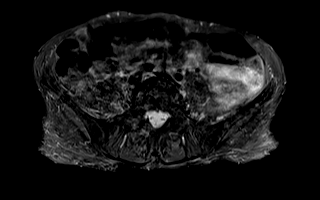
[im 48/48]
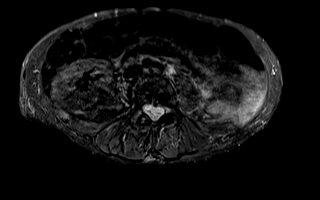

[Series 9: T2 · sagittal · 5.0mm · 1.02mm/px · 13 of 55 slices shown]
[im 1/55]
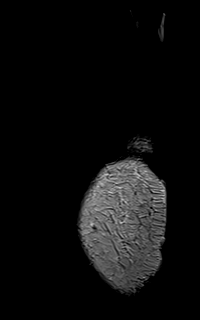
[im 5/55]
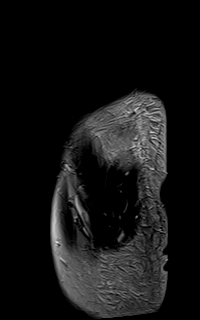
[im 10/55]
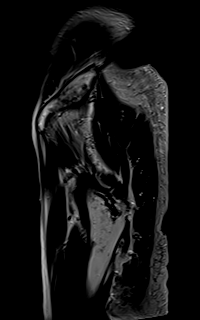
[im 14/55]
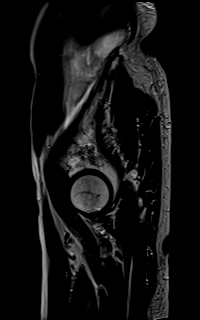
[im 19/55]
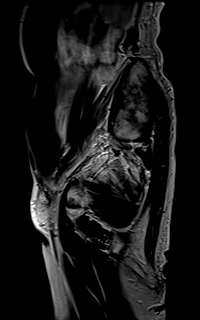
[im 23/55]
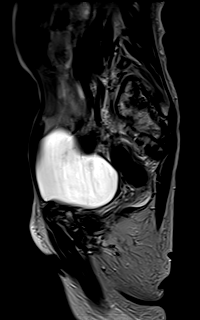
[im 28/55]
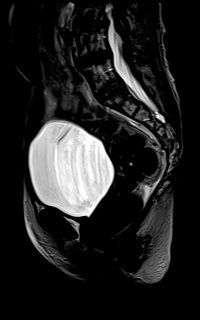
[im 32/55]
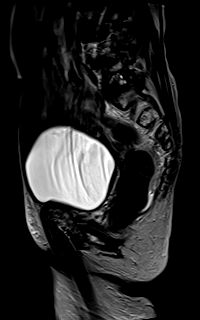
[im 37/55]
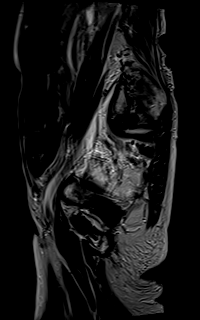
[im 41/55]
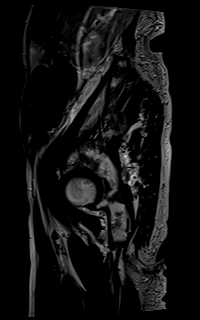
[im 46/55]
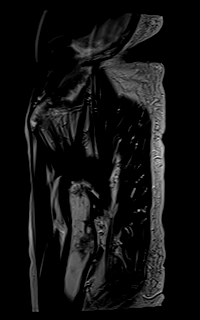
[im 50/55]
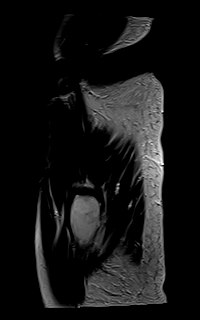
[im 55/55]
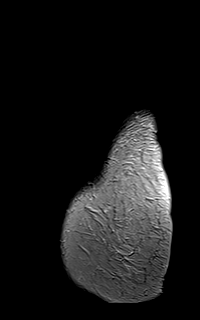

[48 of 48 positions shown; findings below may reference images not displayed]

FINDINGS: Urinary Tract:  The bladder is unremarkable. No mass calculi.

Bowel: The rectum, sigmoid colon visualized small-bowel loops are
grossly normal.

Vascular/Lymphatic: No aneurysm or adenopathy.

Reproductive:  Unremarkable.

Other:  No pelvic mass or adenopathy.

Musculoskeletal: There are sacral insufficiency fractures with
surrounding marrow edema. No displaced fractures are identified. The
iliac bones are intact.

Both hips are normally located. Advanced degenerative changes, right
greater than left but no hip fracture or AVN. The pubic symphysis SI
joints are intact.

Mild edema like signal changes in the iliacus muscles bilaterally.
No muscle tear or mass.
IMPRESSION: 1. Bilateral sacral insufficiency fractures.
2. Advanced hip joint degenerative changes, right greater than left
but no fracture or AVN.
